# Patient Record
Sex: Female | Born: 1955 | Hispanic: Refuse to answer
Health system: Midwestern US, Community
[De-identification: ages and names within clinical notes are randomized; demographics above are authoritative.]

---

## 2018-08-03 ENCOUNTER — Ambulatory Visit: Attending: Internal Medicine

## 2018-08-03 ENCOUNTER — Ambulatory Visit: Admit: 2018-08-03 | Discharge: 2018-08-03 | Payer: PRIVATE HEALTH INSURANCE | Attending: Internal Medicine

## 2018-08-03 DIAGNOSIS — Z20822 Contact with and (suspected) exposure to covid-19: Secondary | ICD-10-CM

## 2018-08-03 DIAGNOSIS — Z20828 Contact with and (suspected) exposure to other viral communicable diseases: Secondary | ICD-10-CM

## 2018-08-03 NOTE — Progress Notes (Signed)
Letter printed and gave it to the flu clinic staff to give to patient when she comes by

## 2018-08-03 NOTE — Progress Notes (Signed)
Patient was seen at [facility] drive thru flu clinic.     Patient consented to receive care and bill insurance.     Please seen scanned note for visit documentation.     No symptoms- needs to be tested before going back to work

## 2018-08-03 NOTE — Progress Notes (Signed)
She is going to come by Monday to get written documentation that she is negative as she needs to get back to work. Told her to come by the flu clinic

## 2018-08-03 NOTE — Progress Notes (Signed)
Gave letter to Amil Amen at [facility]

## 2018-08-03 NOTE — Progress Notes (Signed)
Patient was seen at [facility] drive thru flu clinic.     Patient consented to receive care and bill insurance.     Please seen scanned note for visit documentation.     No symptoms- needs to be tested before going back to work

## 2018-08-03 NOTE — Progress Notes (Signed)
Gave letter to Julia at [facility]

## 2018-08-03 NOTE — Progress Notes (Signed)
Letter printed and gave it to the flu clinic staff to give to patient when she comes by

## 2018-08-03 NOTE — Progress Notes (Signed)
She is going to come by Monday to get written documentation that she is negative as she needs to get back to work. Told her to come by the flu clinic

## 2018-08-07 LAB — NOVEL CORONAVIRUS (COVID-19): SARS-CoV-2, NAA: NOT DETECTED

## 2018-08-07 LAB — COVID-19: SARS-CoV-2, NAA: NOT DETECTED

## 2019-01-22 IMAGING — CR XR CHEST 1 VIEW
1 series · 1 of 1 positions shown · non-contrast
Comparison: none

XR CHEST 1 VIEW
INDICATION: Shortness of breath
LOCATION CODE: 1

[AP]
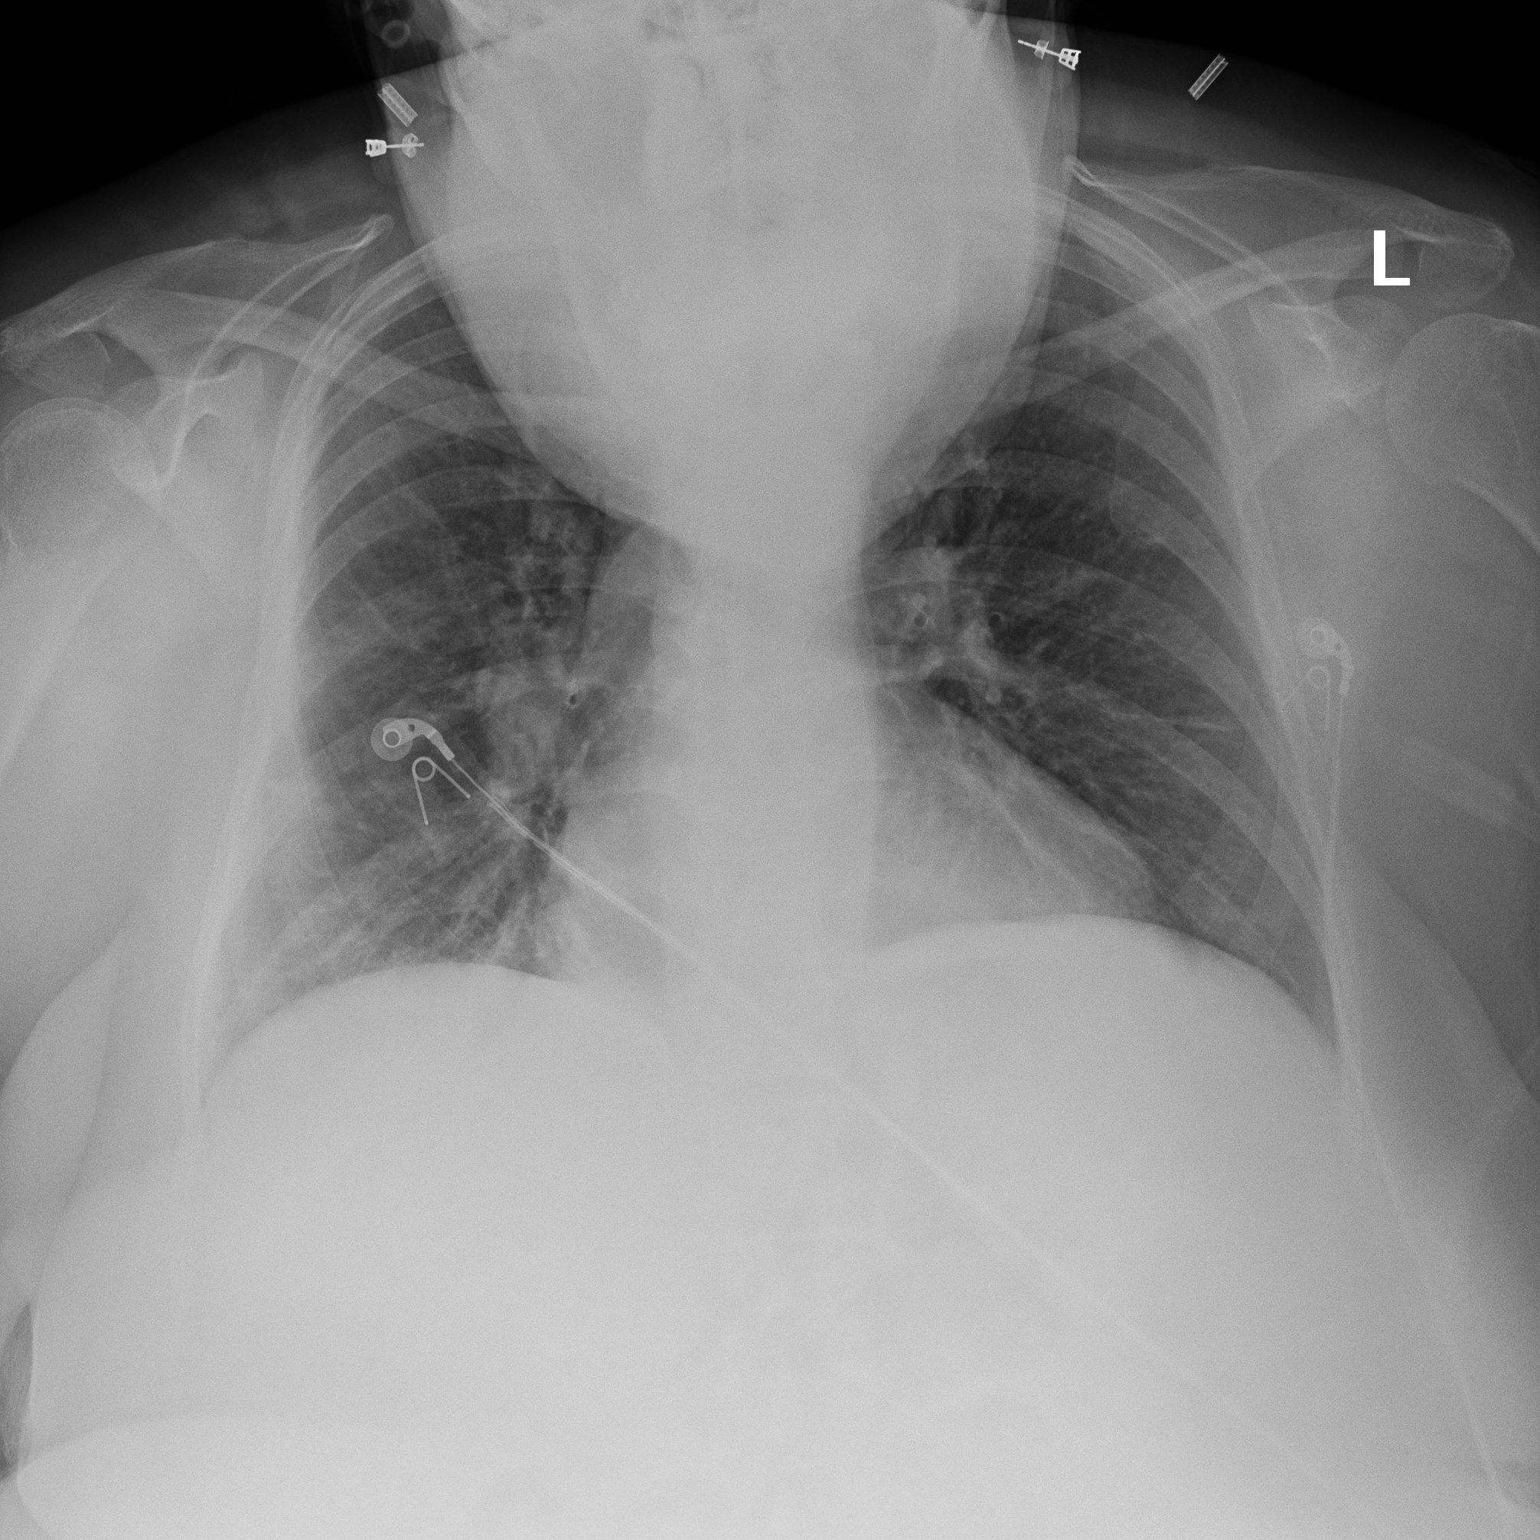

[1 of 1 positions shown; findings below may reference images not displayed]

FINDINGS: Limited study. The patient's face obscures majority of the upper lungs. Consider repeat. The lungs are grossly clear. Normal heart size. No large pneumothoraces. No pleural effusions.
IMPRESSION: Limited study. No gross acute findings. Recommend repeat.

## 2019-01-30 IMAGING — CR XR CHEST 1 VIEW
1 series · 1 of 1 positions shown · non-contrast
Comparison: none

SINGLE PORTABLE CHEST:
CLINICAL INDICATION: chest pain
REFERENCE: 01/22/2019

[AP]
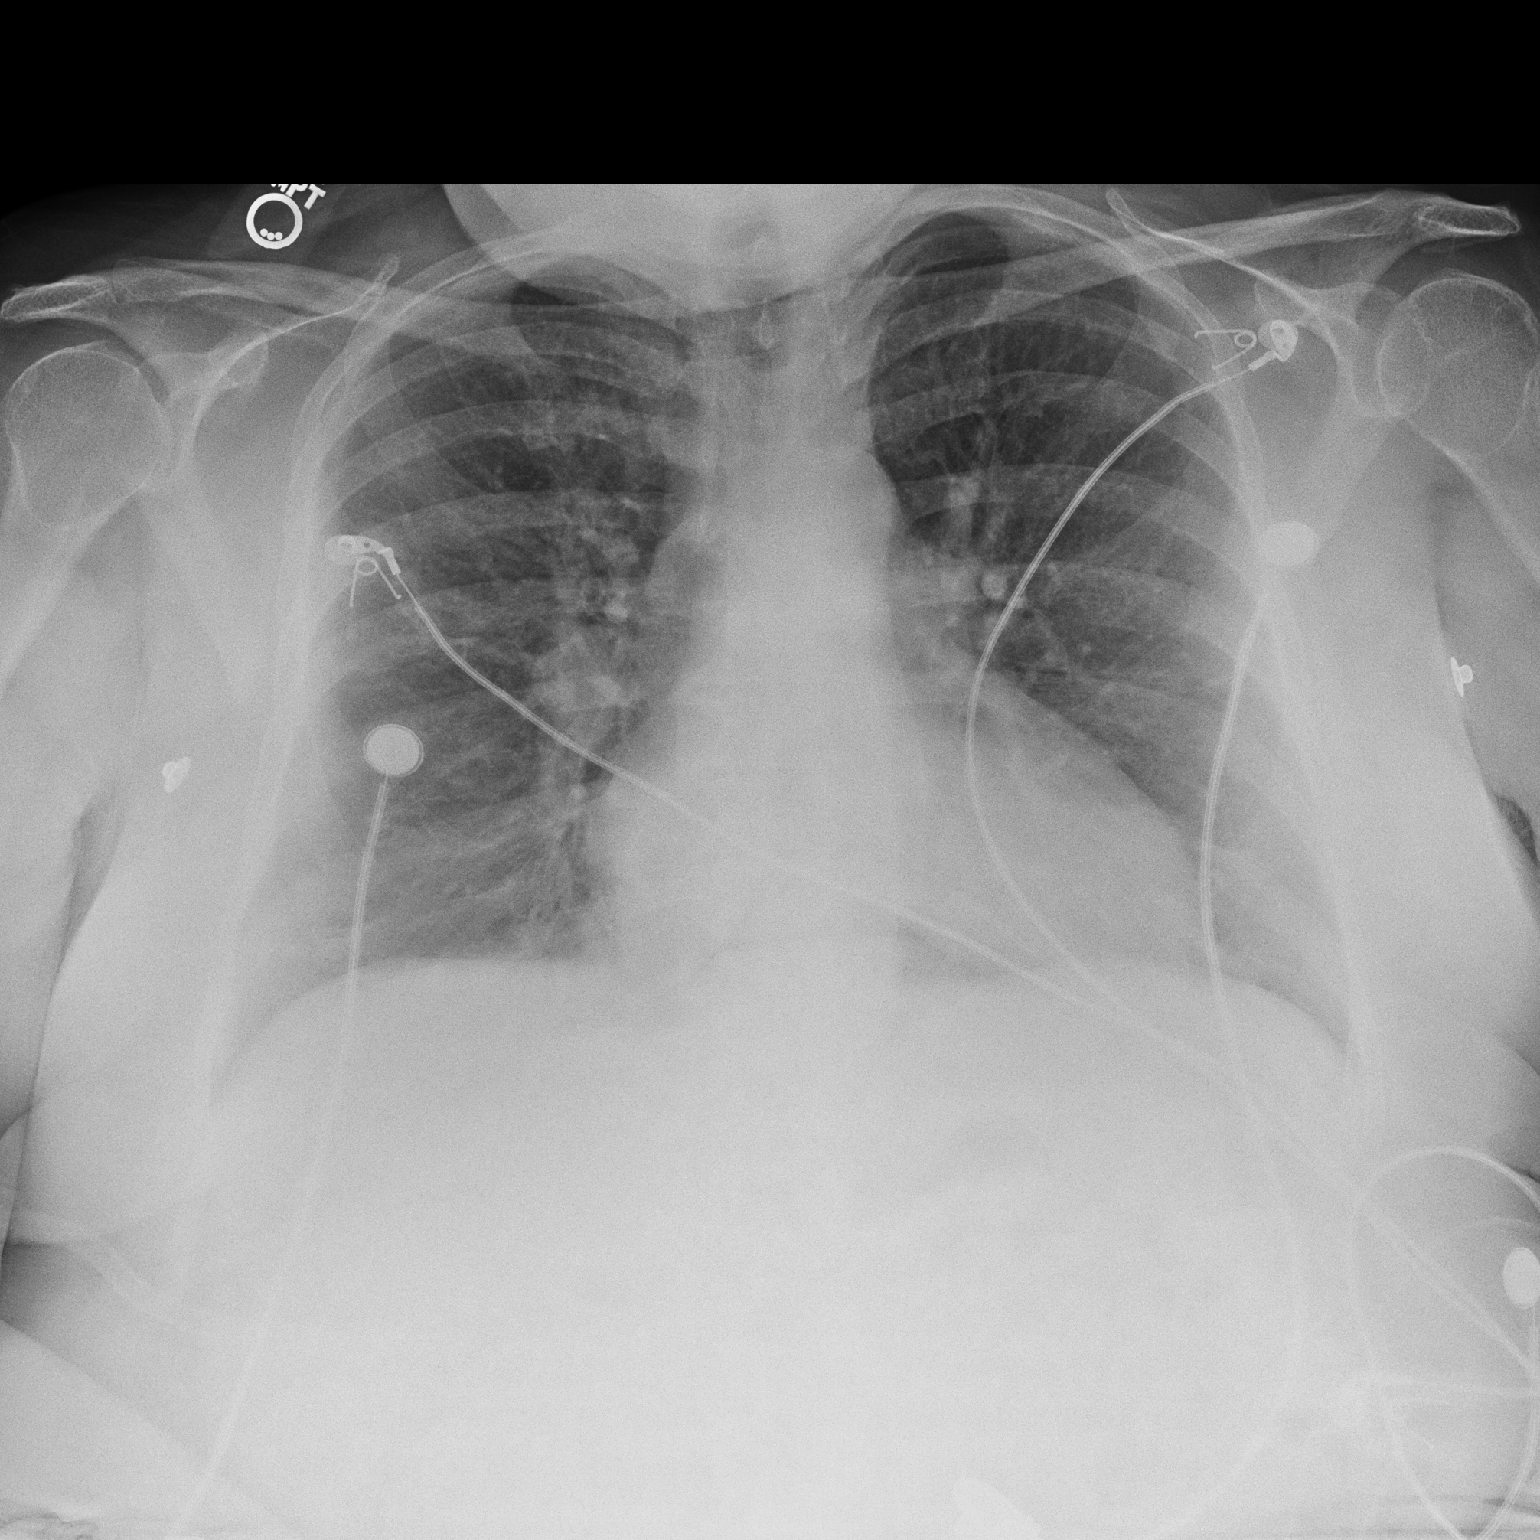

[1 of 1 positions shown; findings below may reference images not displayed]

FINDINGS: Single radiograph of the chest demonstrates normal cardiomediastinal contours.  Obesity and underpenetration limit evaluation.
The lungs demonstrate no mass, infiltrate, or effusion.
Surrounding osseous and soft tissue structures demonstrate no acute abnormality.
IMPRESSION: Limited exam with no acute cardiopulmonary process identified.
LOCATION CODE: 1

## 2019-02-23 IMAGING — MG MAMMO BREAST SCREENING BILATERAL
4 series · 4 of 4 positions shown · non-contrast
Comparison: none

This is a summary report. The complete report is available in the patient's medical record. If you cannot access the medical record, please contact the sending organization for a detailed fax or copy.
EXAM:
Mammogram breast screening bilateral
REASON FOR EXAM: screening
HISTORY: Patient is 63 y.o. No relevant family history has been documented for this patient. Hormone history includes other and estrogen replacement therapy. Surgical history includes hysterectomy. Medical history includes hypertension and diabetes mellitus.
LMP: No LMP recorded. Patient has had a hysterectomy.
BREAST COMPOSITION:
The breasts are almost entirely fatty.

[L MLO]
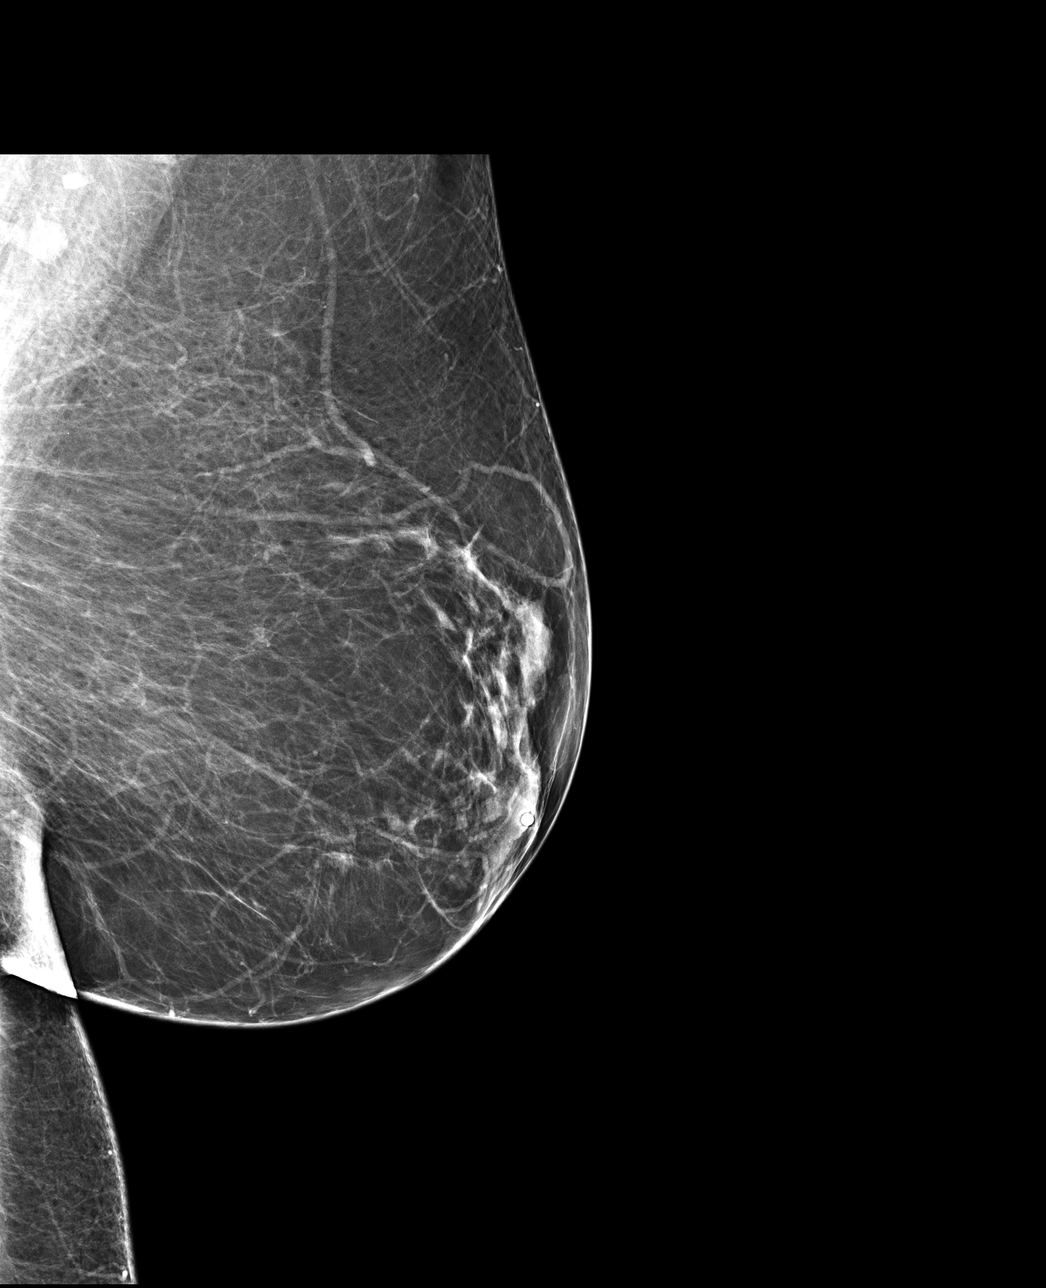

[L CC]
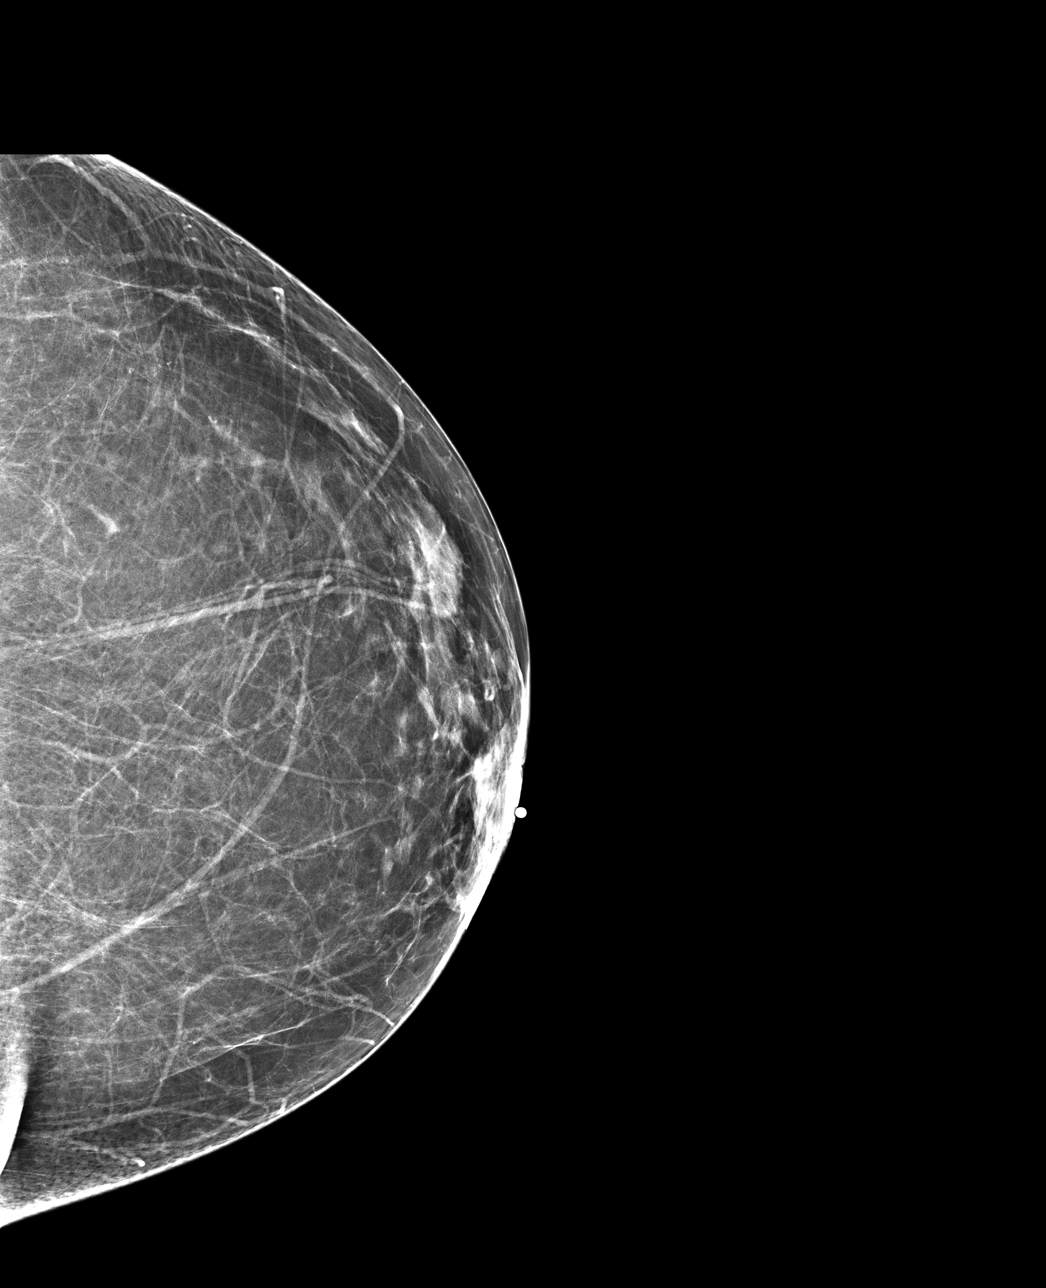

[R CC]
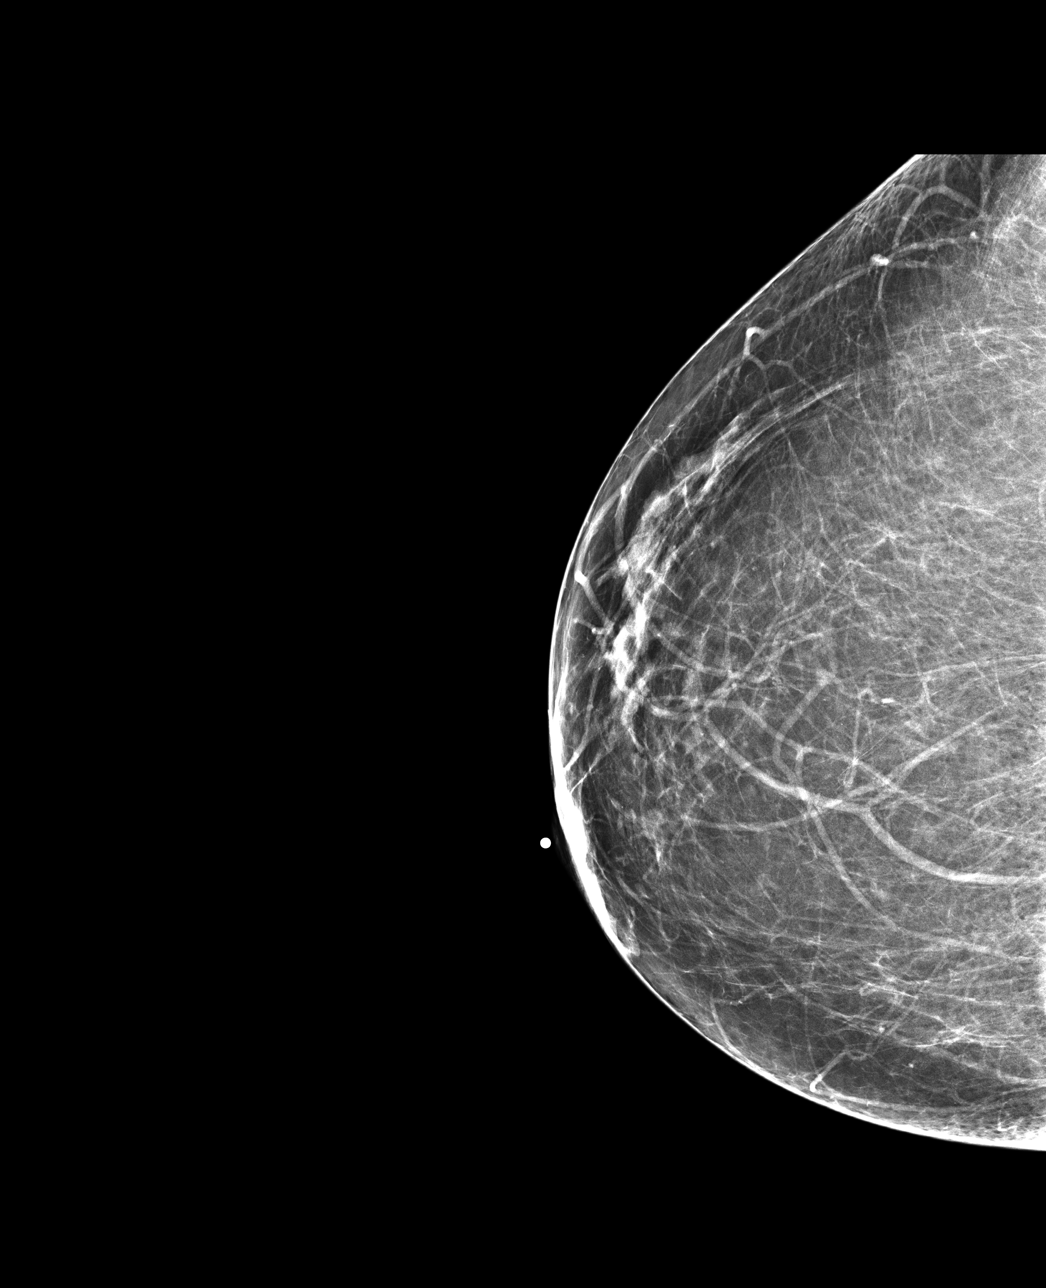

[R MLO]
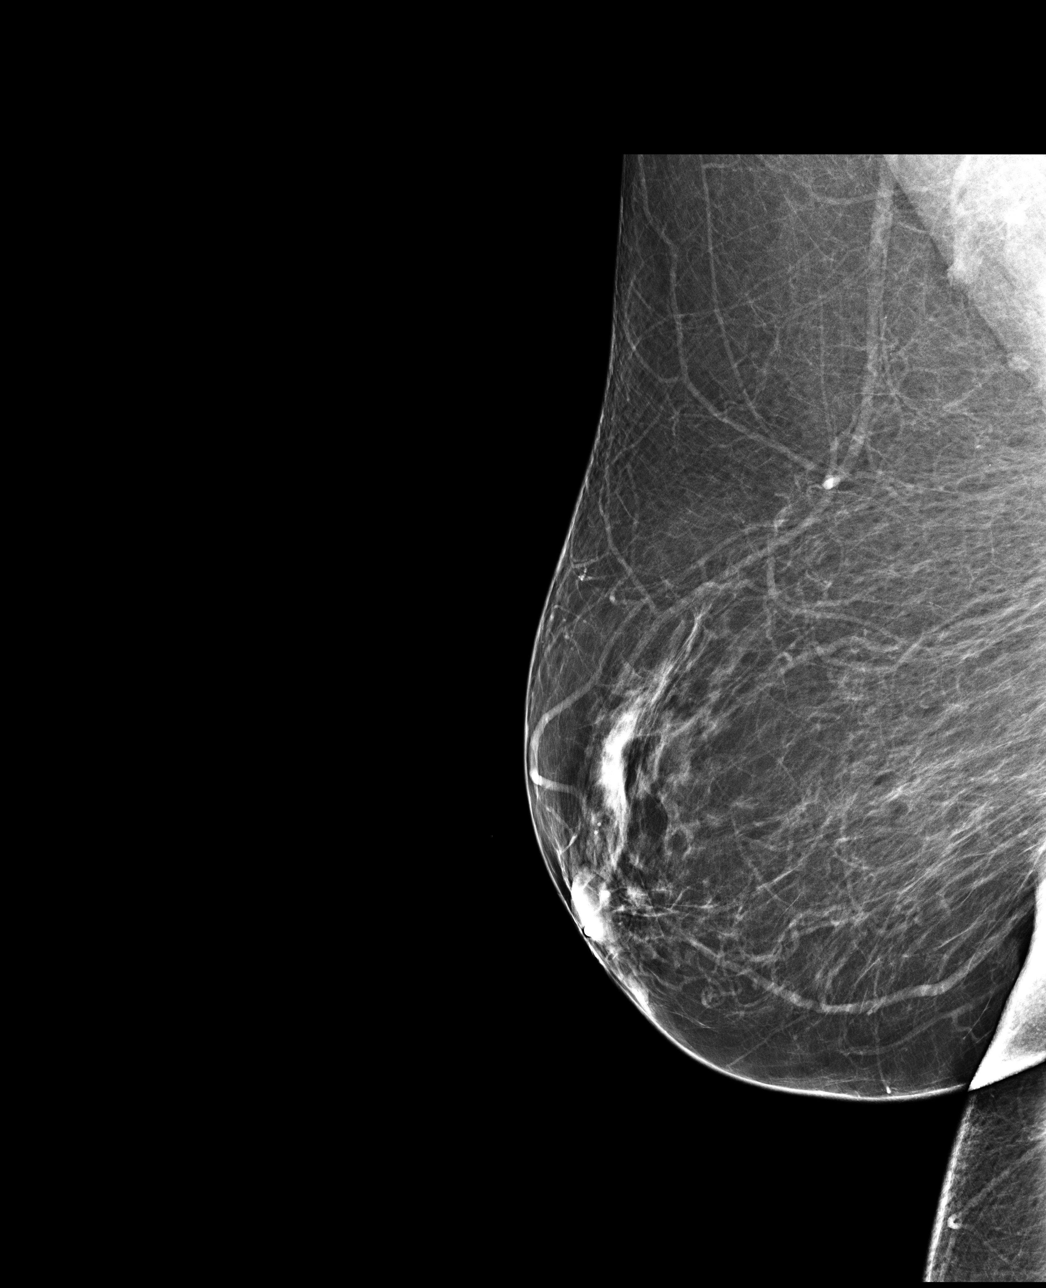

[4 of 4 positions shown; findings below may reference images not displayed]

FINDINGS: Computer-aided detection was utilized by the radiologist in the interpretation of this examination.
There are no suspicious masses, calcifications or areas of architectural distortion. No evidence of malignancy.
FILMS COMPARED:
08/12/2015 SCREEN MAMMO DIR DIGITAL BIALT
11/05/2016 SCREEN MAMMO DIR DIGITAL BIALT
11/16/2017 SCREEN MAMMO DIR DIGITAL BIALT
IMPRESSION: No mammographic evidence of malignancy.
BI-RADS CATEGORY:
Left: 2 - Benign
Right: 2 - Benign
Overall: 2 - Benign
RECOMMENDATION:
- Routine Screening Mammogram in 1 Yr for both breasts.
LOCATION CODE: 1

## 2020-04-17 IMAGING — MG MAMMO BREAST SCREENING BILATERAL
4 series · 4 of 4 positions shown · non-contrast
Comparison: none

This is a summary report. The complete report is available in the patient's medical record. If you cannot access the medical record, please contact the sending organization for a detailed fax or copy.
EXAM:
Mammogram breast screening bilateral
REASON FOR EXAM: screening
HISTORY: Patient is 64 y.o. Family medical history includes diabetes in sister and hypertension in 2 relatives (mother, sister). Hormone history includes other and estrogen replacement therapy. Surgical history includes hysterectomy. Medical history includes hypertension and diabetes mellitus.
LMP: No LMP recorded. Patient has had a hysterectomy.
BREAST COMPOSITION:
The breasts have scattered areas of fibroglandular density.

[R CC]
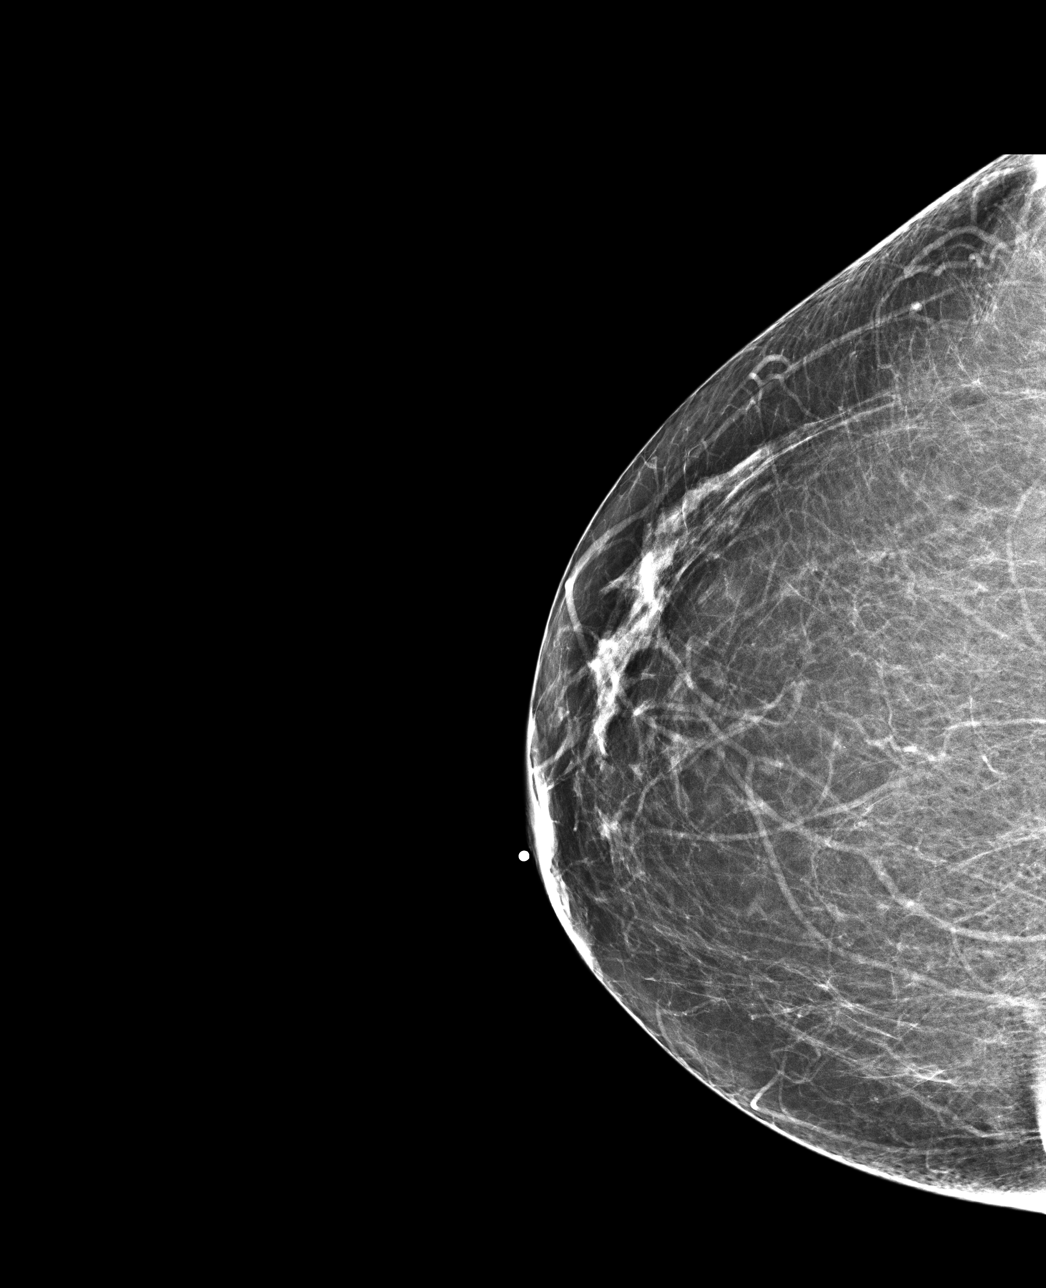

[L MLO]
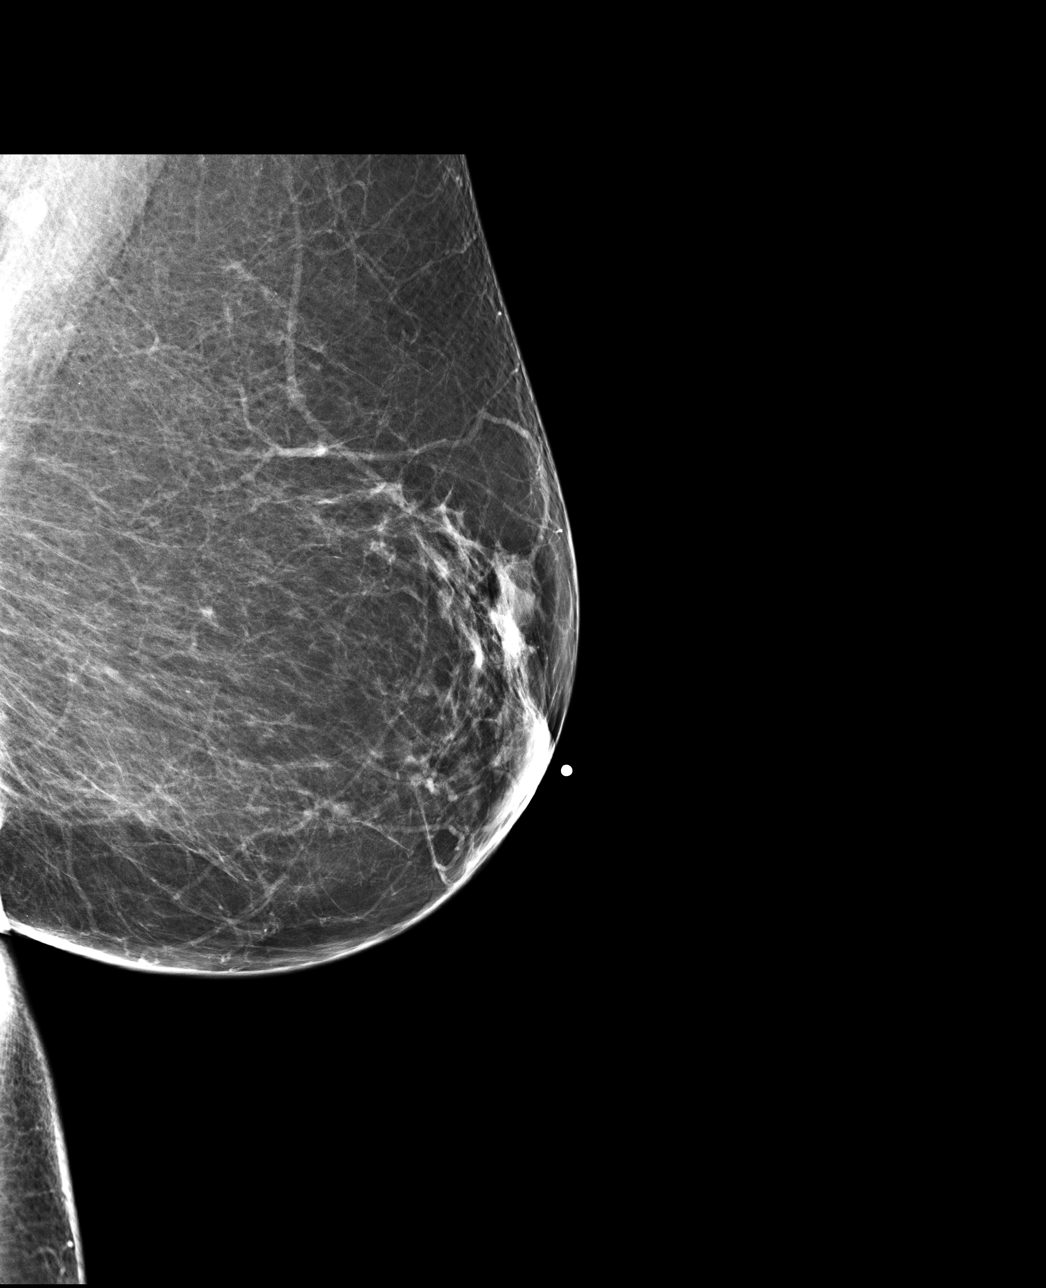

[L CC]
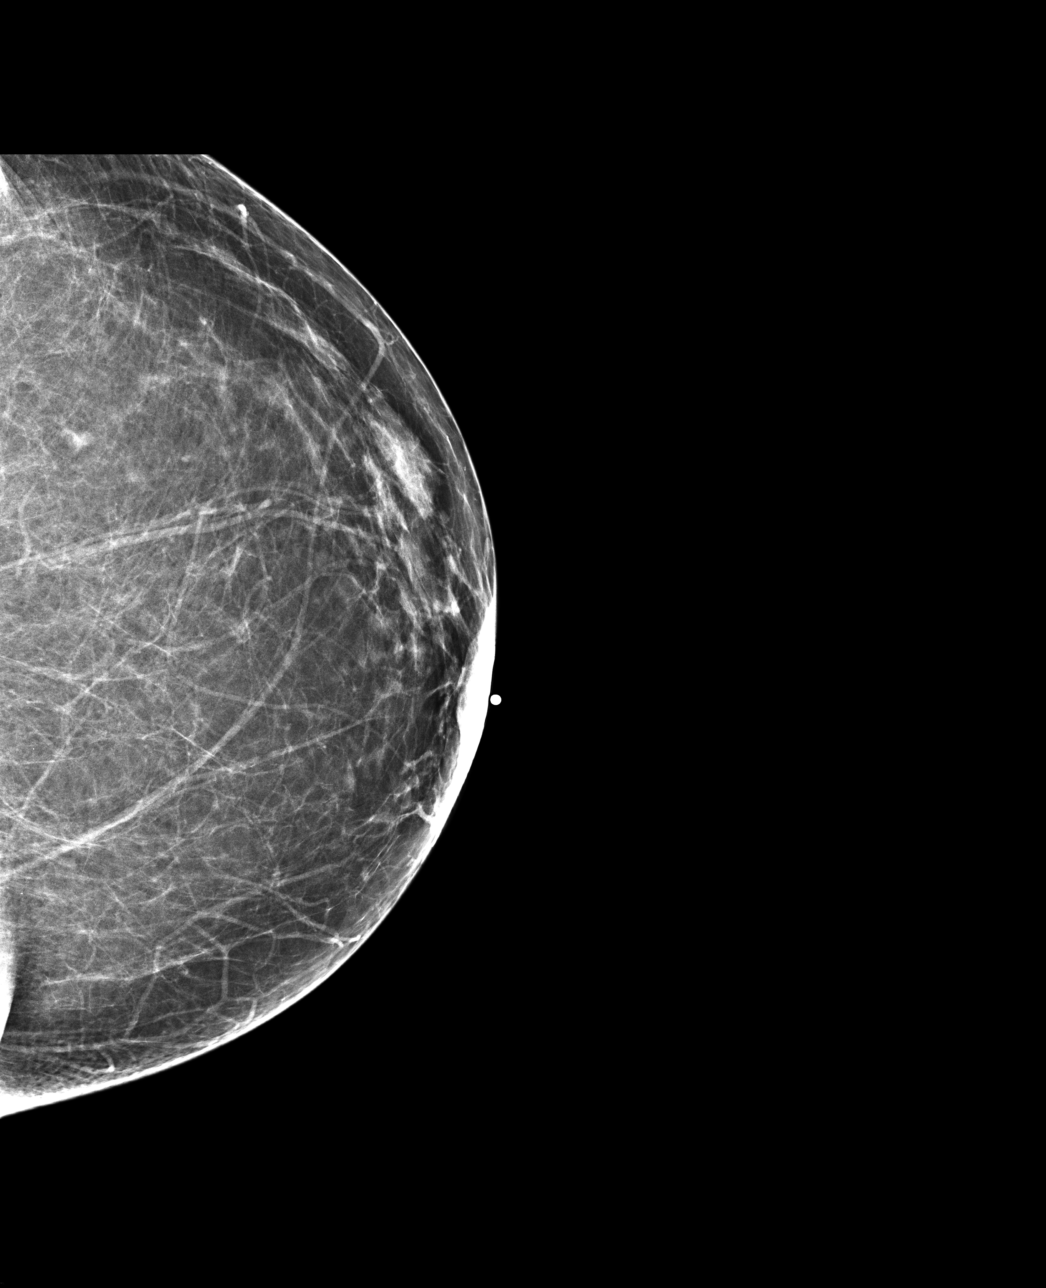

[R MLO]
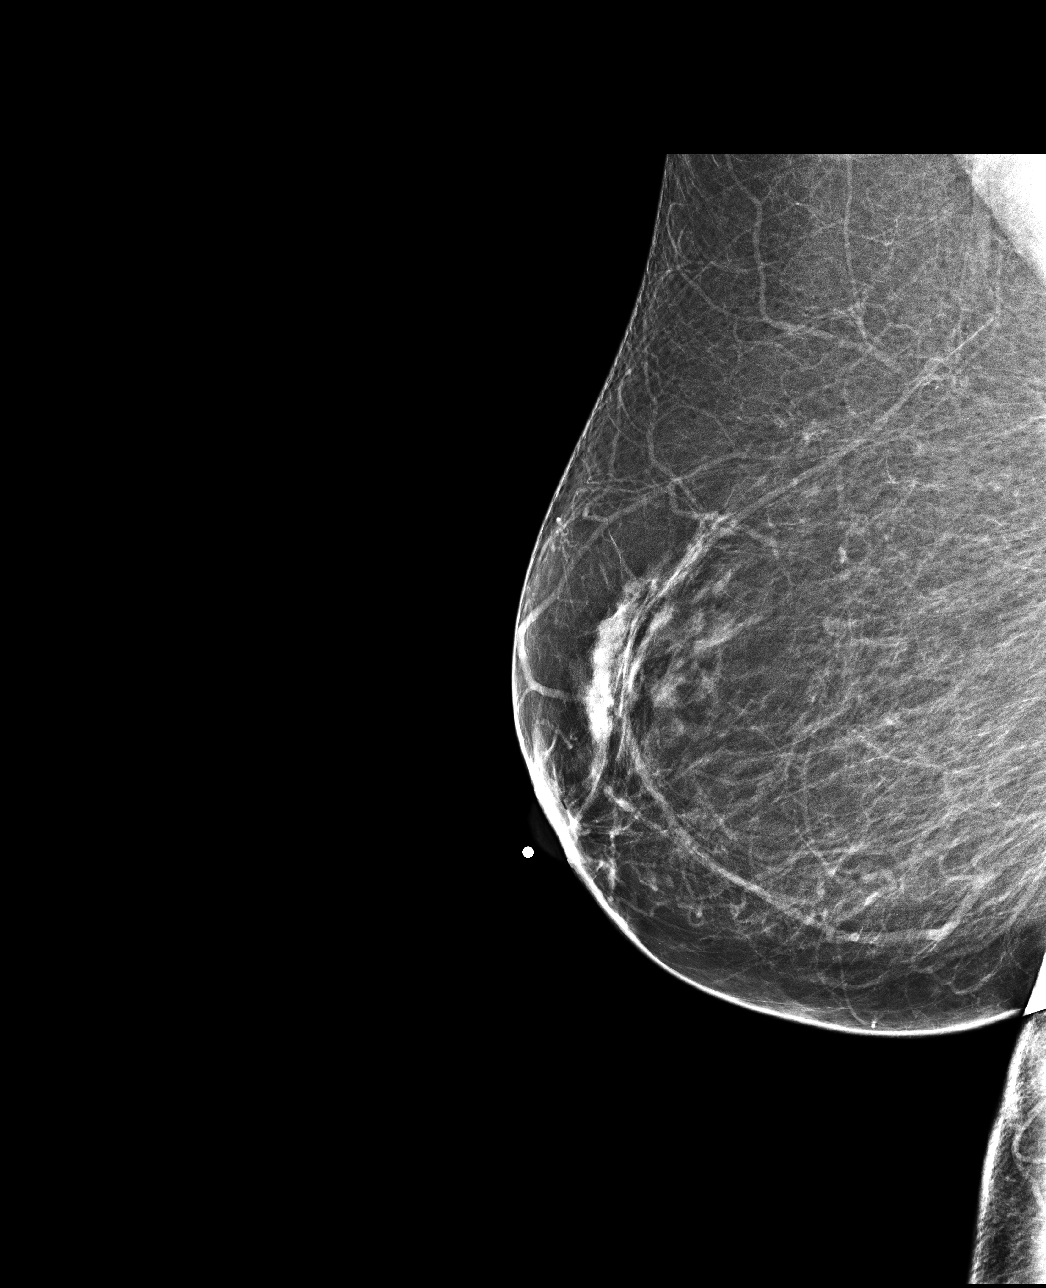

[4 of 4 positions shown; findings below may reference images not displayed]

FINDINGS: Computer-aided detection was utilized by the radiologist in the interpretation of this examination.
There are no suspicious masses, calcifications or areas of architectural distortion. No evidence of malignancy.
FILMS COMPARED:
05/03/2014 Mammogram breast diagnostic bilateral
08/12/2015 SCREEN MAMMO DIR DIGITAL BIALT
11/05/2016 SCREEN MAMMO DIR DIGITAL BIALT
11/16/2017 SCREEN MAMMO DIR DIGITAL BIALT
02/23/2019 Mammogram breast screening bilateral
IMPRESSION: No mammographic evidence of malignancy.
BI-RADS CATEGORY:
Overall: 1 - Negative
RECOMMENDATION:
- Routine Screening Mammogram in 1 Yr.
LOCATION CODE: 1

## 2021-01-31 IMAGING — MR MRI LUMBAR SPINE WITHOUT CONTRAST
6 of 9 series · 23 of 48 positions shown · non-contrast
Comparison: None available

FINAL REPORT:
HISTORY: Lumbar radiculopathy
Procedure: MRI of the lumbar spine without contrast
TECHNIQUE: Multisequence, multiplanar imaging of the lumbar spine was performed.

[Series 5001: survey · sagittal · 8.0mm · 0.88mm/px · 2 of 8 slices shown]
[im 1/8]
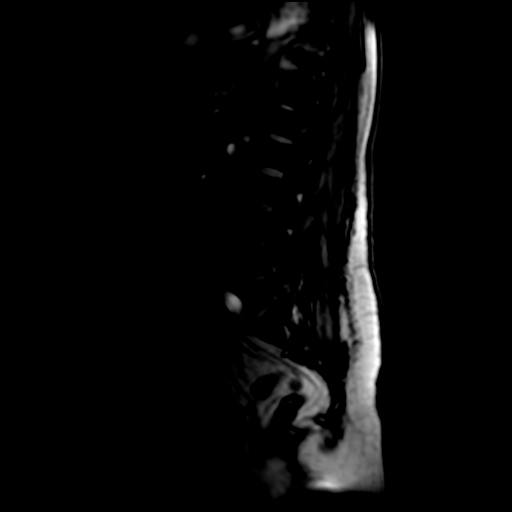
[im 8/8]
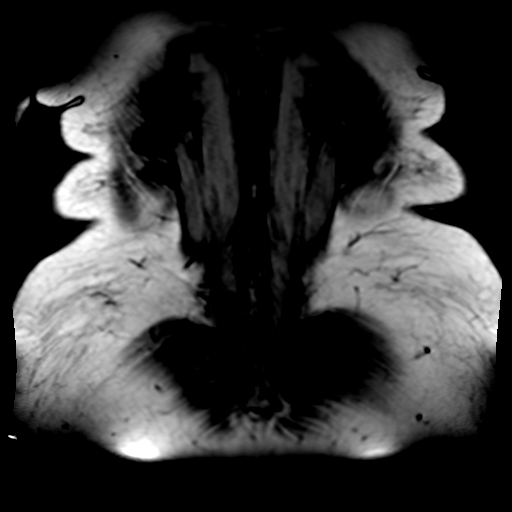

[Series 6001: T2 · sagittal · 4.0mm · 0.55mm/px · 3 of 17 slices shown (1 of 2)]
[im 1/17]
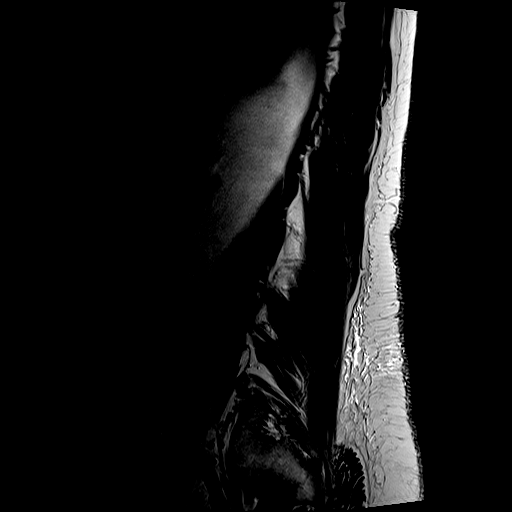
[im 9/17]
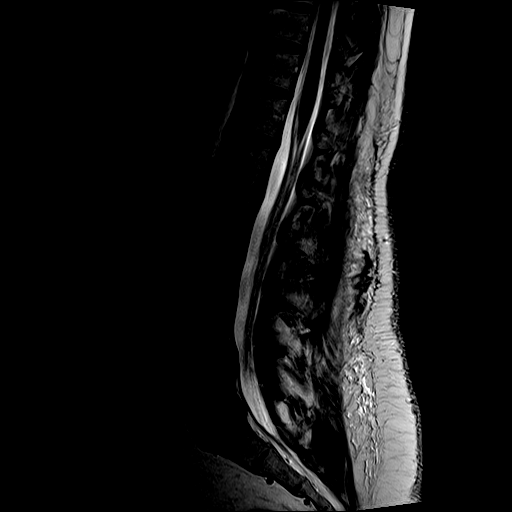
[im 17/17]
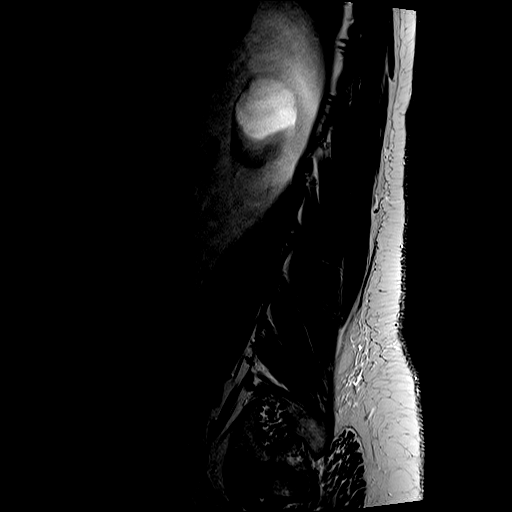

[Series 7001: STIR · sagittal · 4.0mm · 0.88mm/px · 3 of 17 slices shown]
[im 1/17]
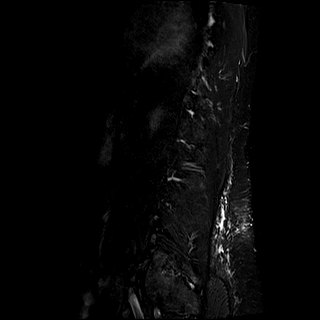
[im 9/17]
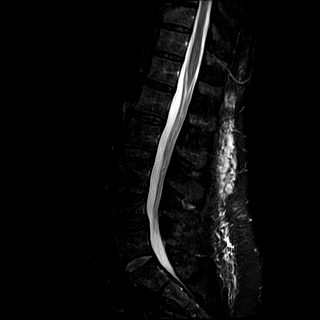
[im 17/17]
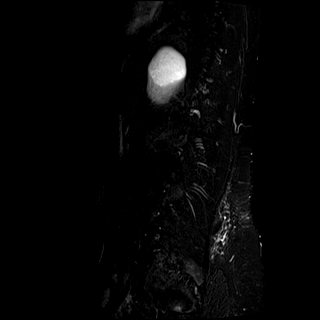

[Series 8001: T1 · sagittal · 4.0mm · 0.62mm/px · 3 of 17 slices shown (1 of 2)]
[im 1/17]
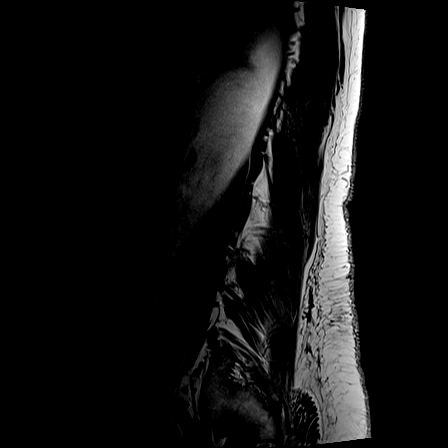
[im 9/17]
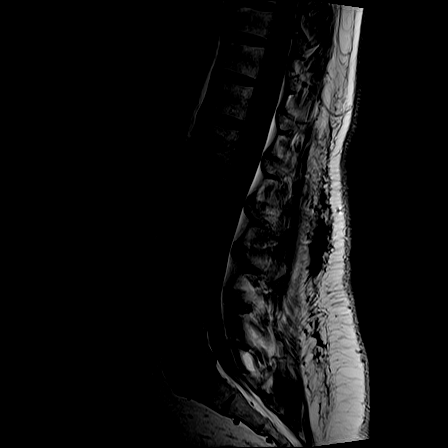
[im 17/17]
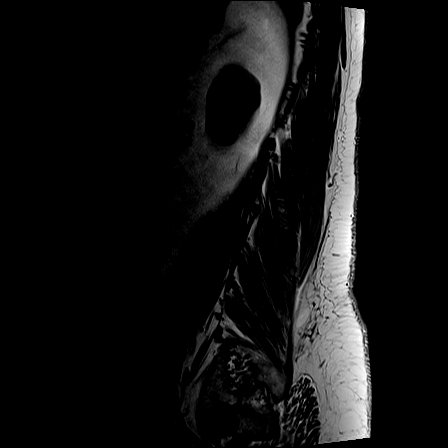

[Series 9001: T2 · axial · 4.0mm · 0.49mm/px · z∈[-99,+101]mm · 6 of 32 slices shown (2 of 2)]
[im 1/32]
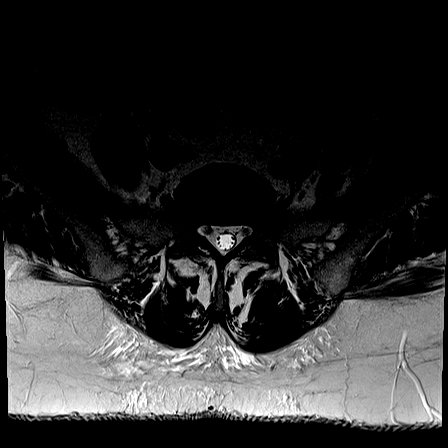
[im 7/32]
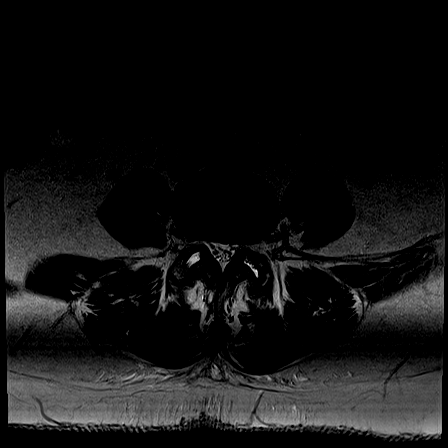
[im 13/32]
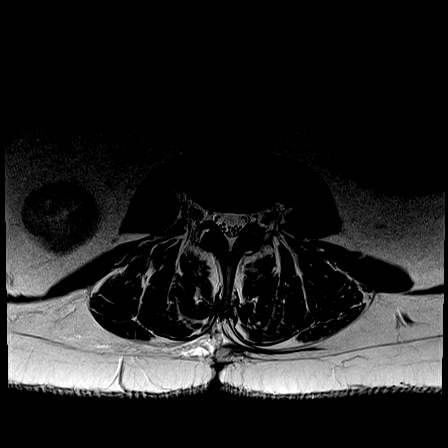
[im 19/32]
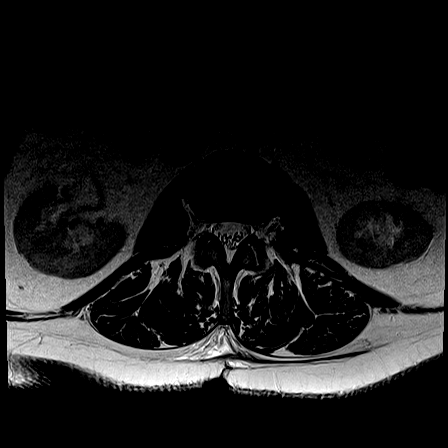
[im 25/32]
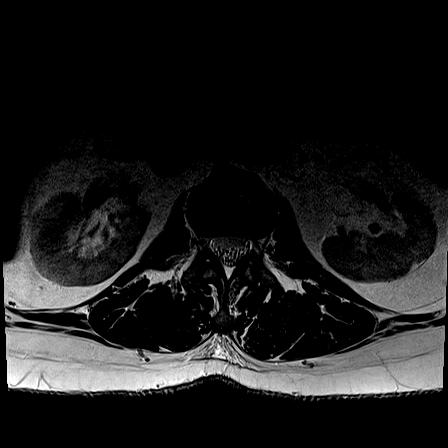
[im 32/32]
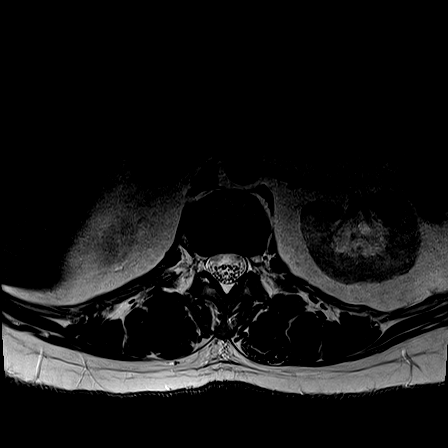

[T1 · axial · 4.0mm · 0.49mm/px · z∈[-99,+101]mm · 6 of 32 slices shown (2 of 2)]
[im 1/32]
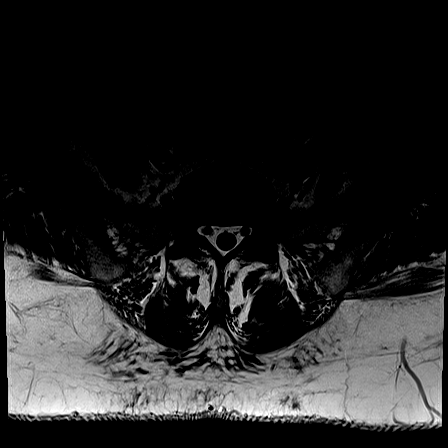
[im 7/32]
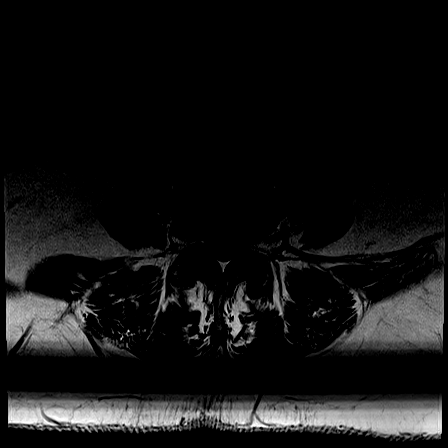
[im 13/32]
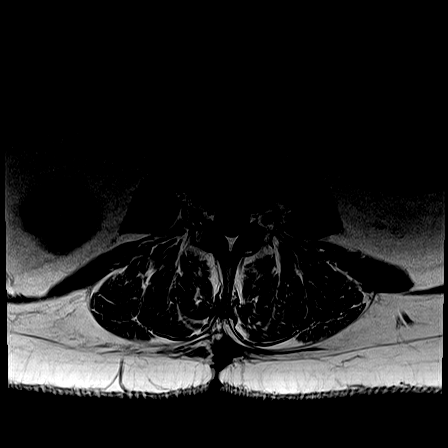
[im 19/32]
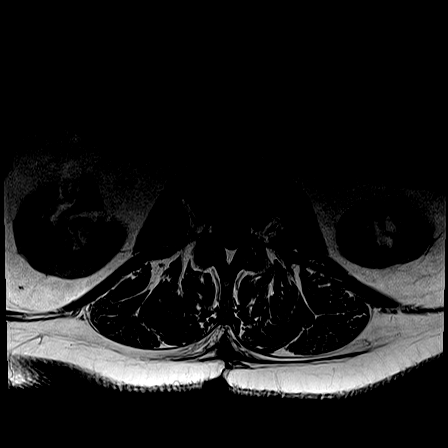
[im 25/32]
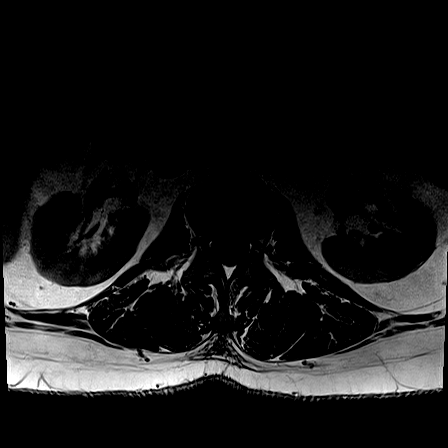
[im 32/32]
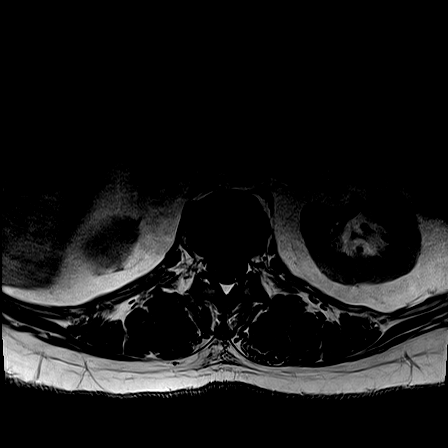

[23 of 48 positions shown; findings below may reference images not displayed]

FINDINGS: No pars defects are noted. The conus medullaris ends at T12-L1 and is normal in appearance. No aggressive marrow signal abnormality is seen. Fluid is seen in the facet joints bilaterally at L4-5. Prevertebral and paraspinal soft tissues are unremarkable.
L1-L2: No significant spinal stenosis or neural foraminal narrowing.
L2-3: No significant spinal stenosis or neural foraminal narrowing.
L3-L4: Ligamentum flavum hypertrophy and broad-based protrusion with mild spinal stenosis and mild neural foraminal narrowing
L4-5: Posterior element hypertrophy and broad-based protrusion with moderate spinal stenosis. Mild bilateral neural foraminal narrowing.
L5-S1: No spinal stenosis. No neural foraminal narrowing.
IMPRESSION: 
IMPRESSION: Degenerative changes with spinal stenosis and neural foraminal narrowing as described above level by level

## 2021-04-18 IMAGING — MG MAMMO BREAST SCREENING BILATERAL
6 series · 6 of 6 positions shown · non-contrast
Comparison: 04/17/2020
Breast density: Scattered fibroglandular densities (B)

This is a summary report. The complete report is available in the patient's medical record. If you cannot access the medical record, please contact the sending organization for a detailed fax or copy.
FINAL REPORT:
MAMMO BREAST SCREENING BILATERAL
INDICATION: 65-year-old female with no personal or family history of breast cancer.
TECHNIQUE: Bilateral digital CC and MLO images of the breasts were obtained. CAD was utilized.

[L CC]
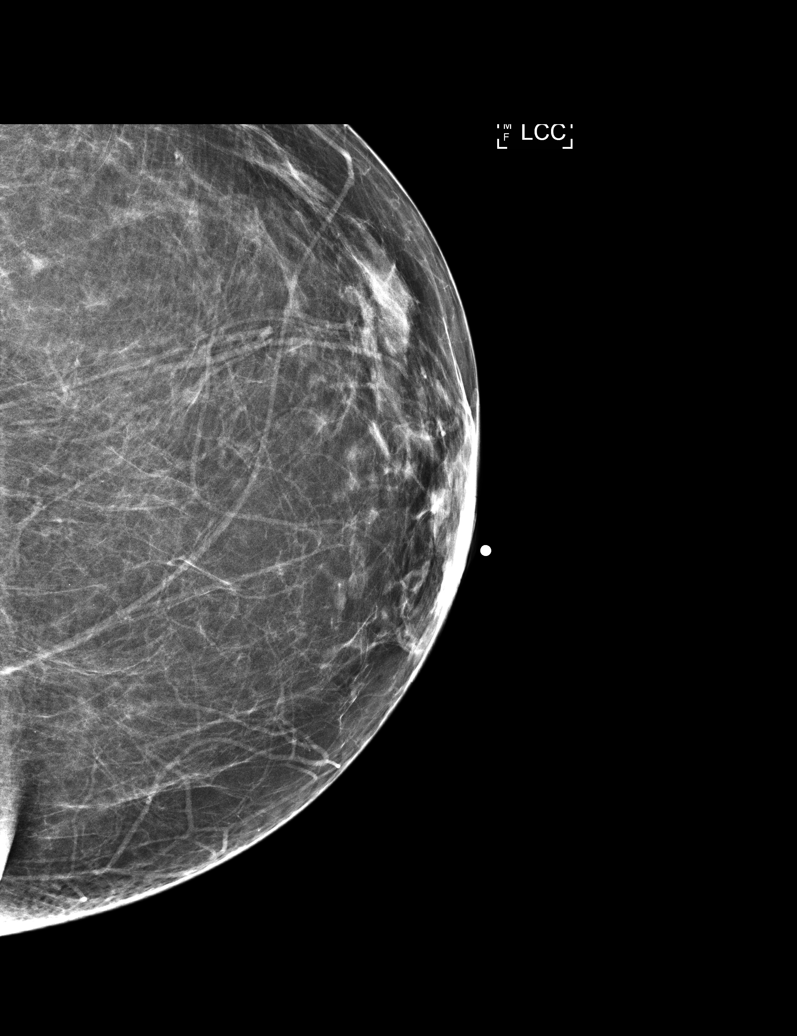

[R MLO]
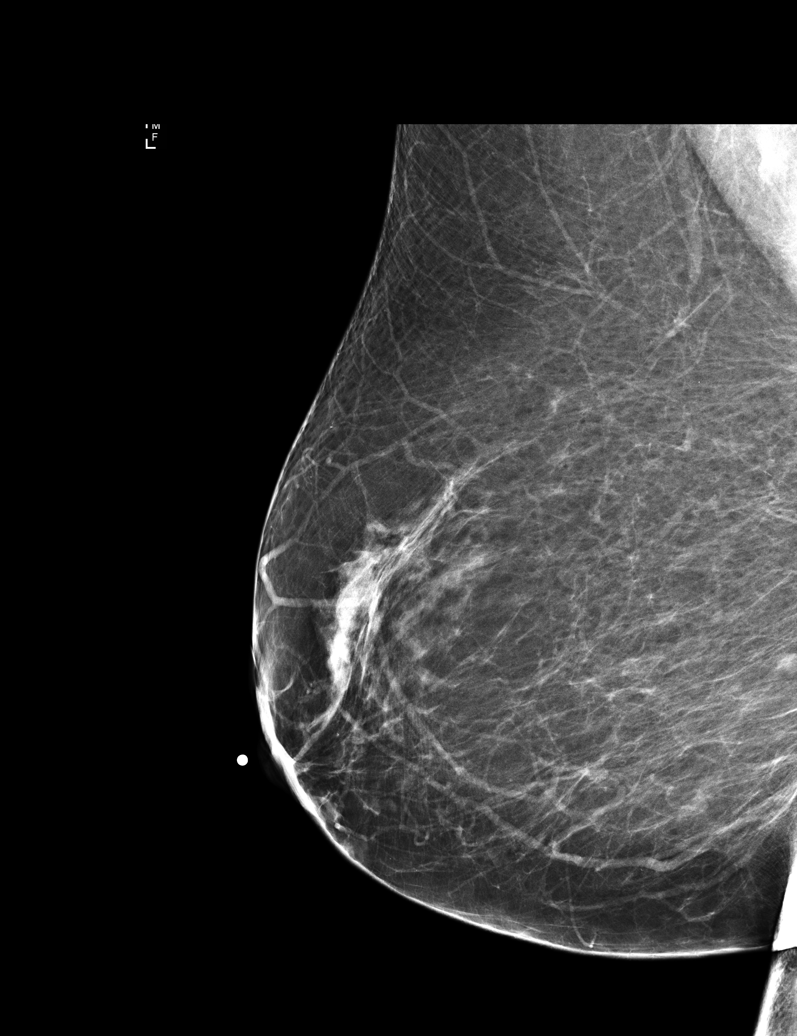

[R XCCL]
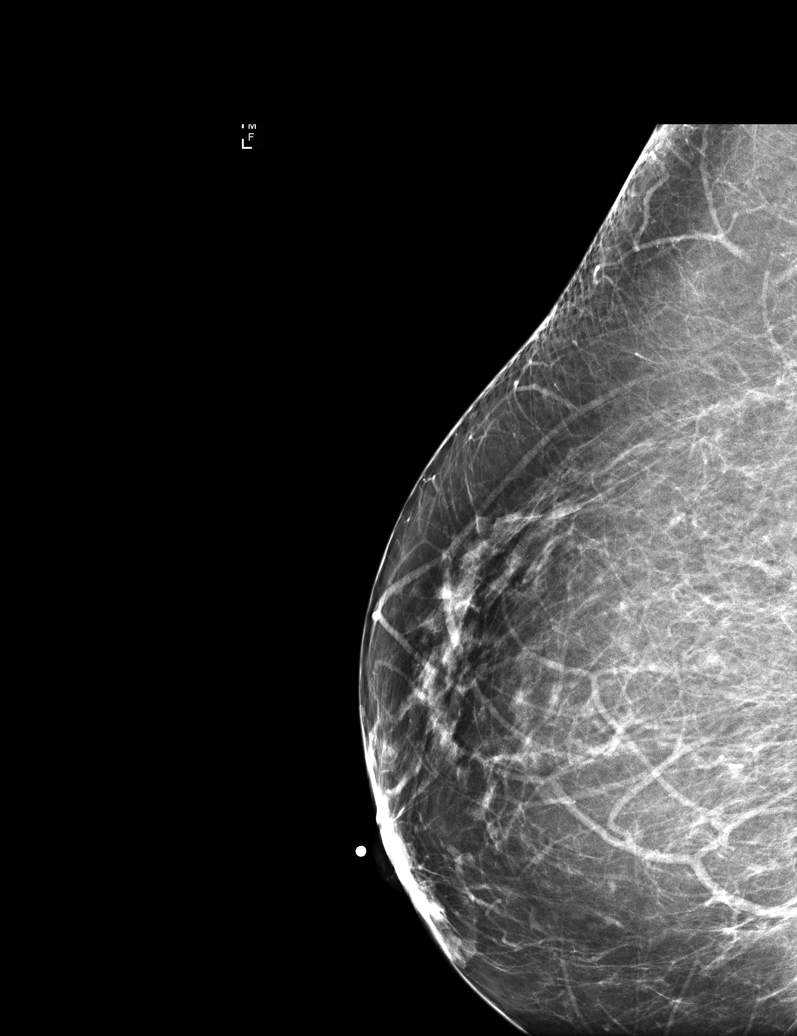

[L MLO]
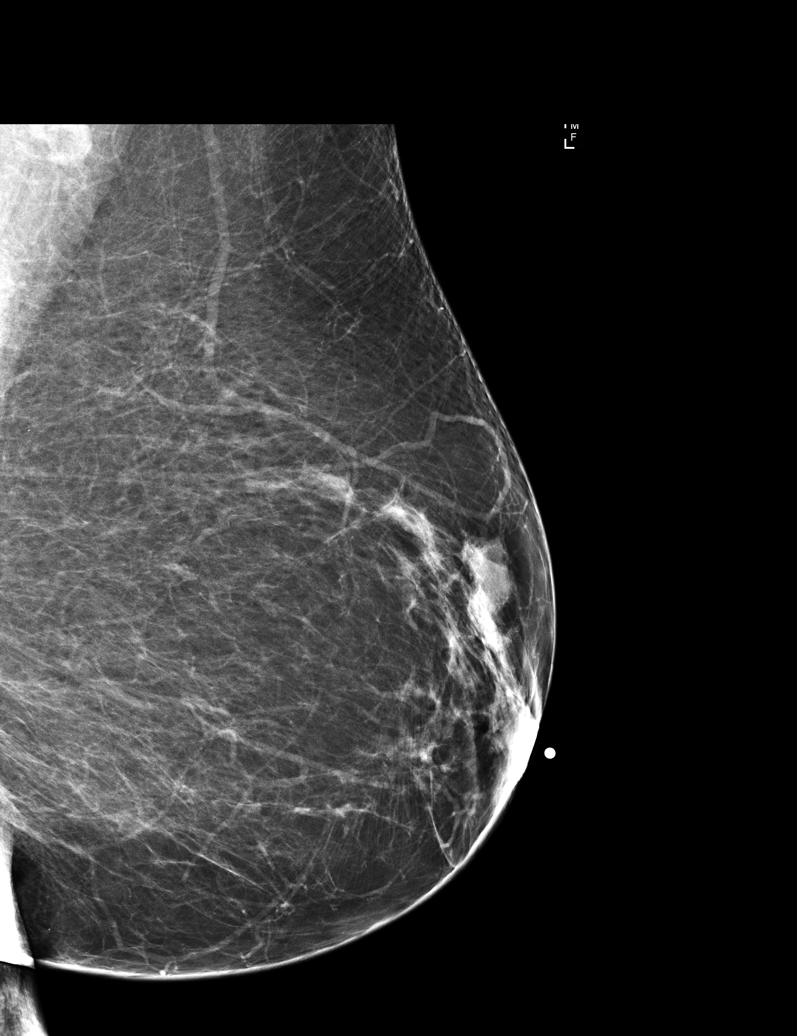

[L XCCL]
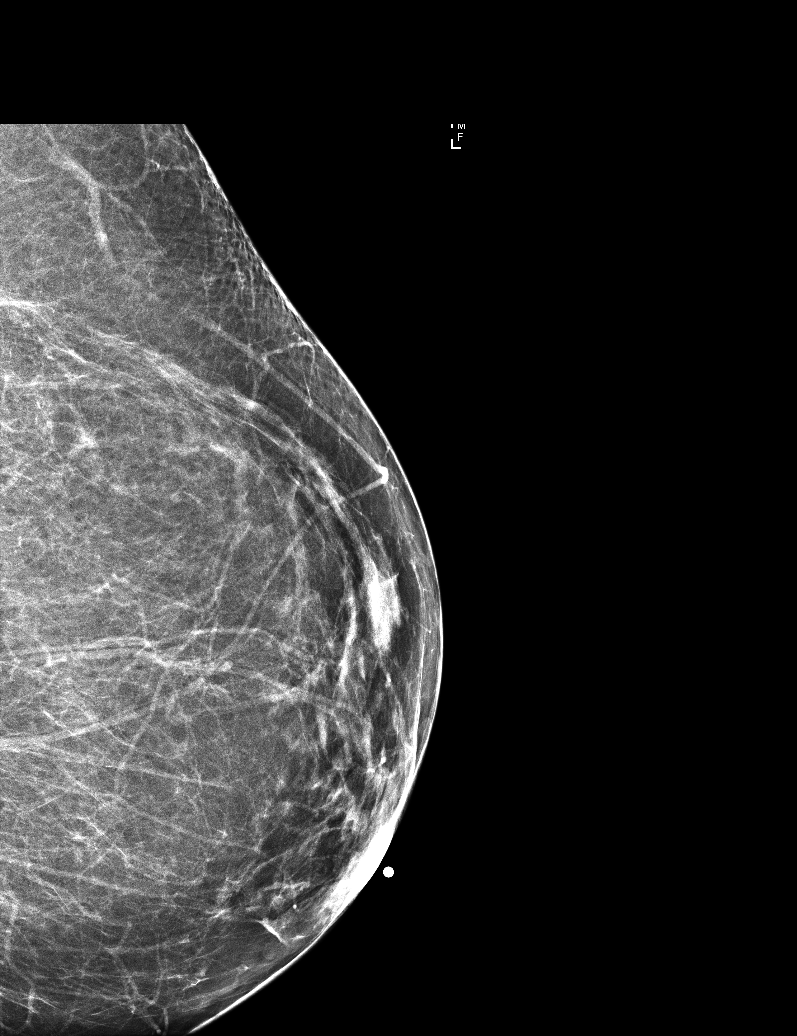

[R CC]
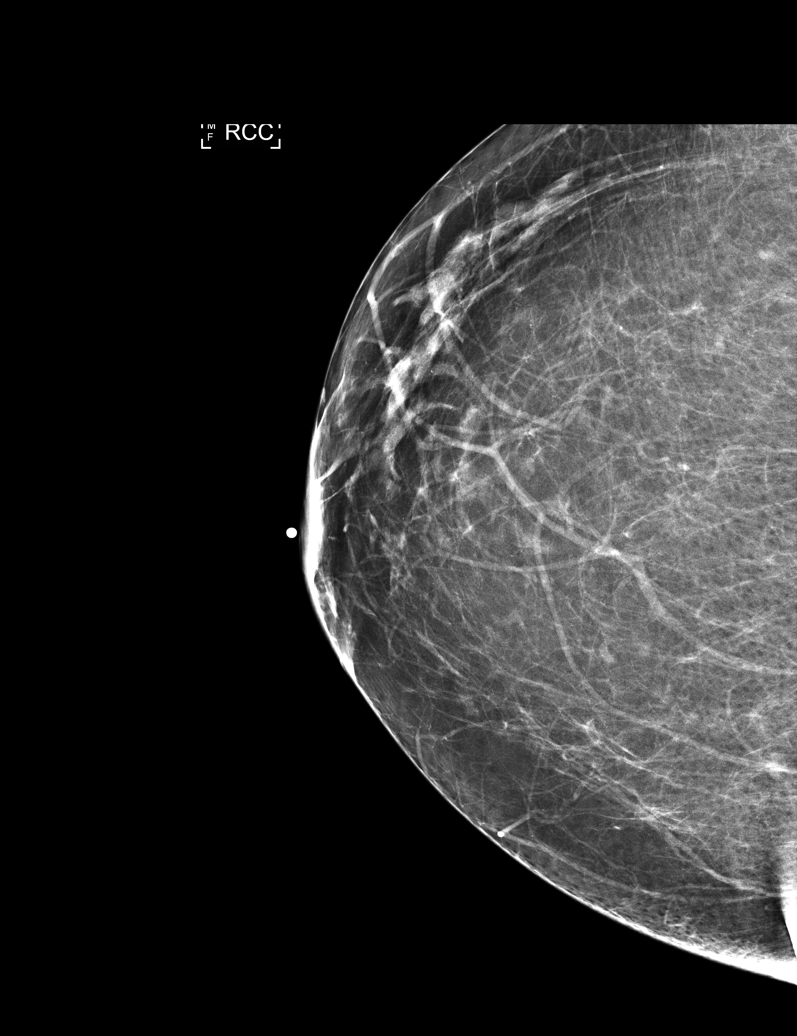

[6 of 6 positions shown; findings below may reference images not displayed]

FINDINGS: No significant abnormalities are noted.
IMPRESSION: 
IMPRESSION: No mammographic evidence of malignancy.
Recommendations: Annual screening.
BI-RADS code: 1; negative
**One must recognize that there is as high as a 10-15% false-negative rate on a single screening mammogram. Current recommendations are for annual screening mammography from age 40 onwards.**
The patient will be entered into a reminder system with a target due date for the next mammogram.

## 2021-11-05 IMAGING — CR XR CHEST 1 VIEW
1 series · 1 of 1 positions shown · non-contrast
Comparison: Chest x-ray from 01/30/2019

FINAL REPORT:
INDICATION: chest pain

[AP]
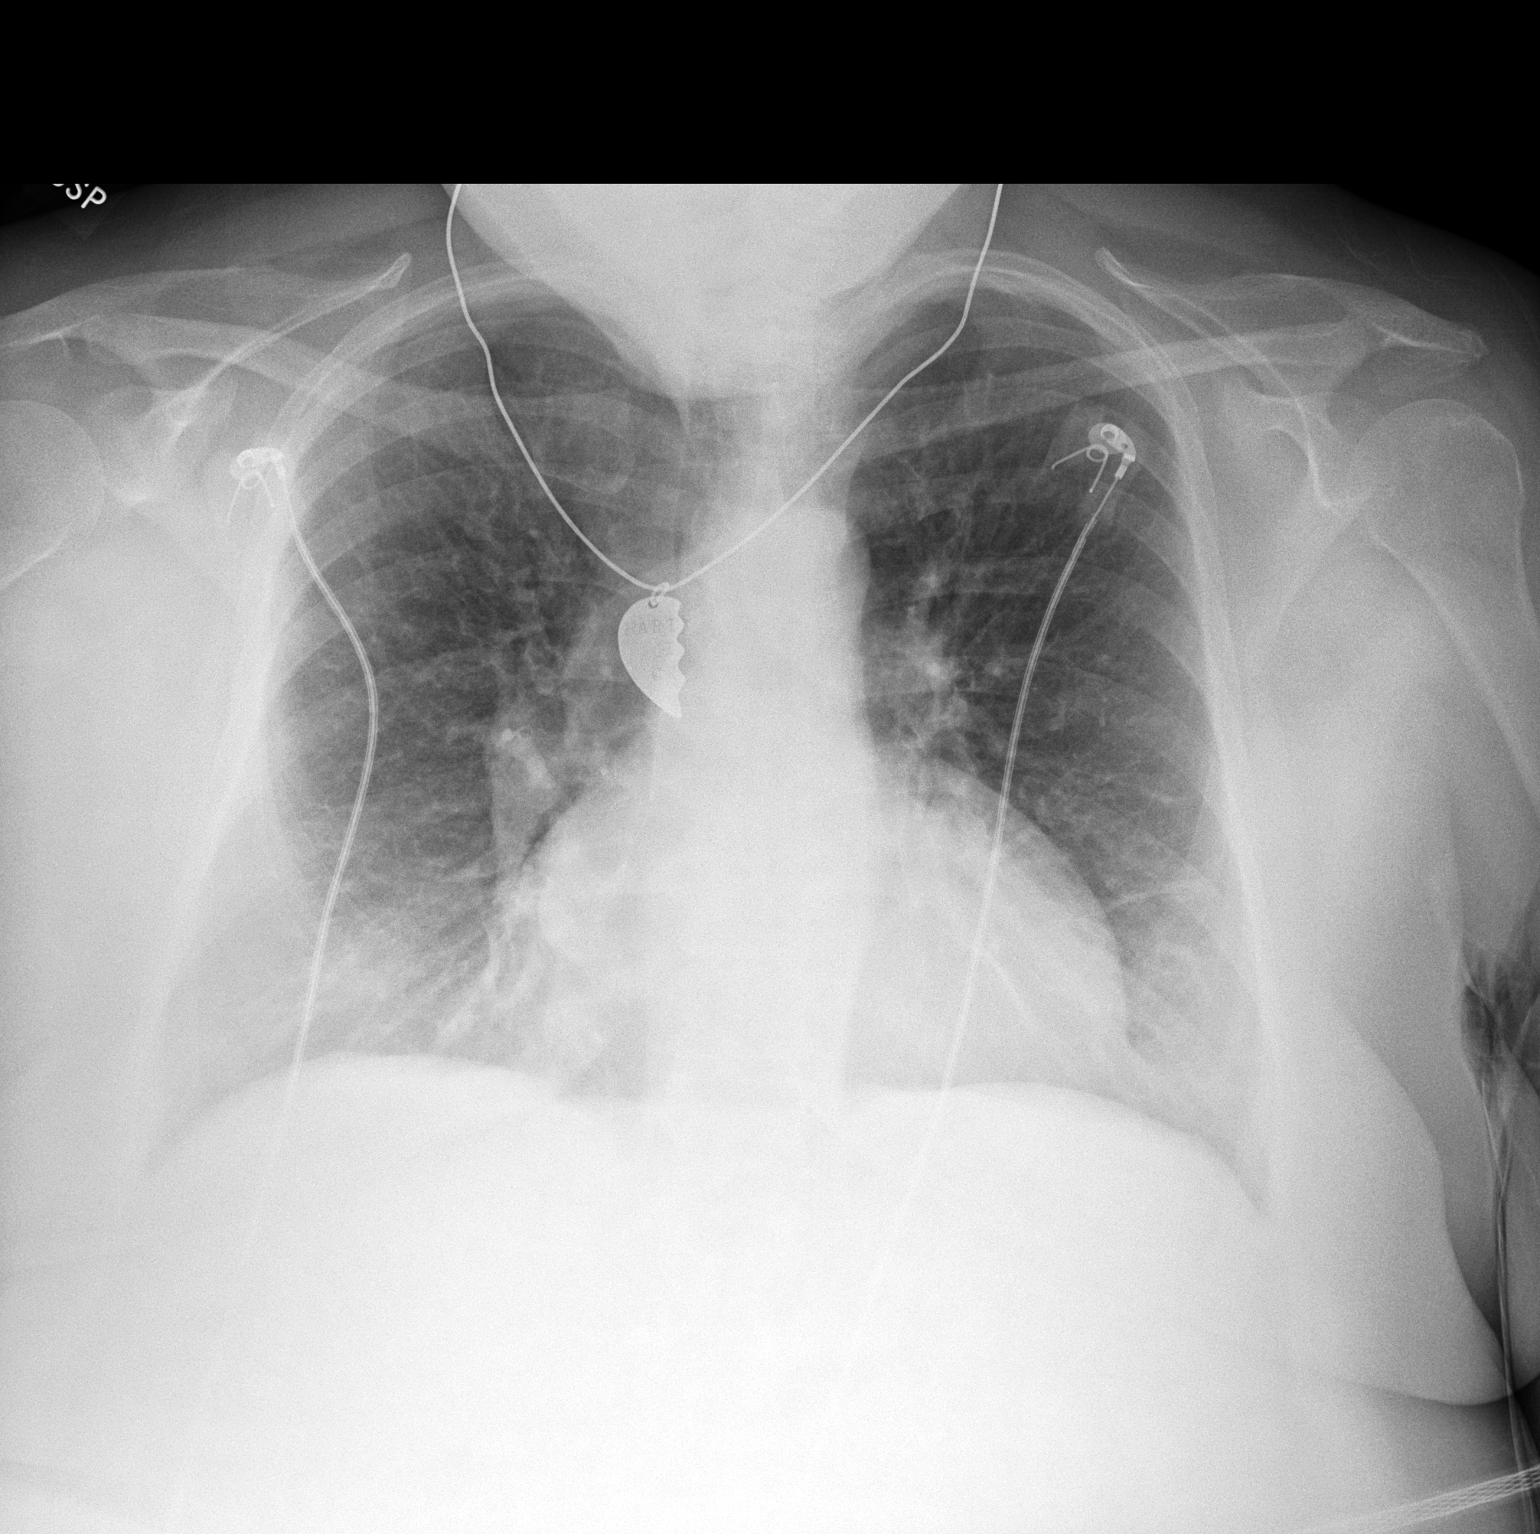

[1 of 1 positions shown; findings below may reference images not displayed]

FINDINGS: 1 view of the chest. The cardiomediastinal contours are within normal limits. No evidence of focal consolidation, pleural effusion, pulmonary edema, or pneumothorax.
IMPRESSION: 
IMPRESSION: No acute cardiopulmonary findings.

## 2022-02-04 IMAGING — CT CT HEART CORONARY ANGIOGRAM
2 of 3 series · 11 of 20 positions shown, 12 images · IV contrast (isovue)
Comparison: none

Procedure: Computed tomography angiography, heart, and coronary arteries, with contrast material, including 3D image postprocessing (including evaluation of cardiac structure and morphology) done with [employer] ISP software.
Acquisition mode: Perspective ECG triggering
Contrast type: Isovue
Complications: None known immediately
Image quality: Good
Scanner:  [employer] Brightview [id number]

[Series 3: coronary cta · axial · 0.49mm/px · z∈[-217,-67]mm · 3 of 241 slices shown, 4 images]
[im 1/241  vessel]
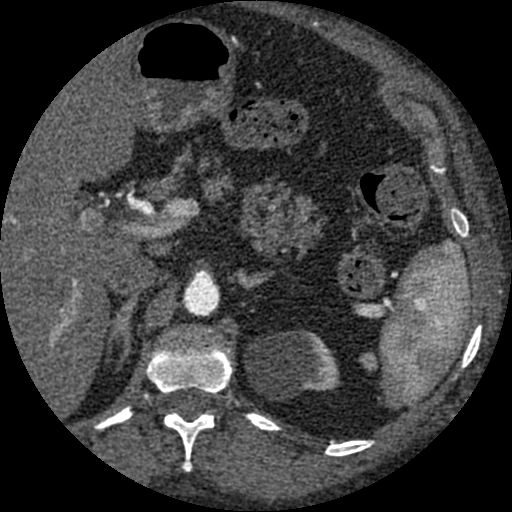
[im 1/241  lung]
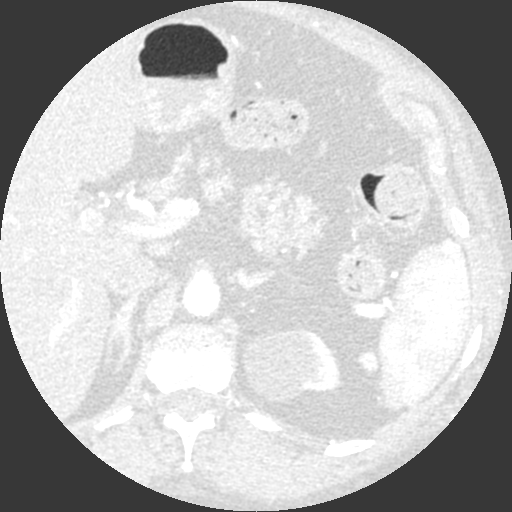
[im 121/241  vessel]
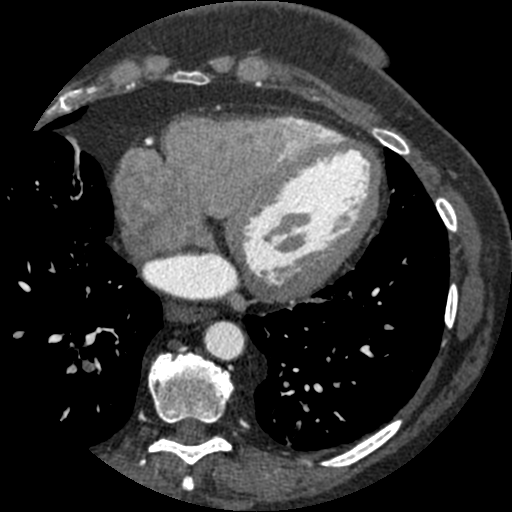
[im 241/241  vessel]
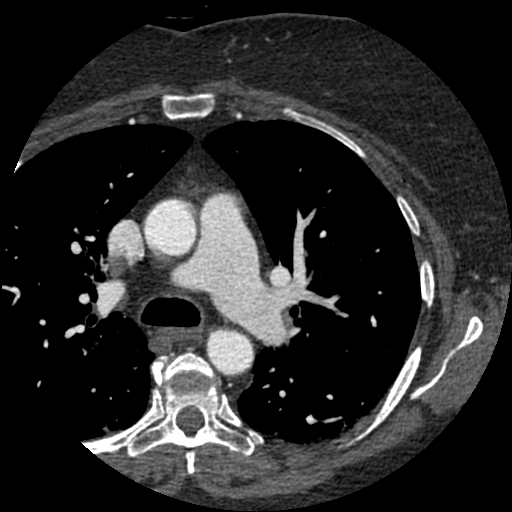

[Series 302: all phases · axial · 0.49mm/px · z∈[-207,-77]mm · 8 of 1210 slices shown]
[im 81/1210  vessel]
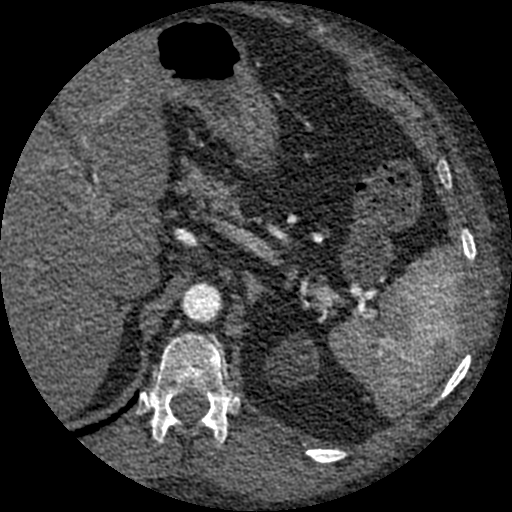
[im 242/1210  vessel]
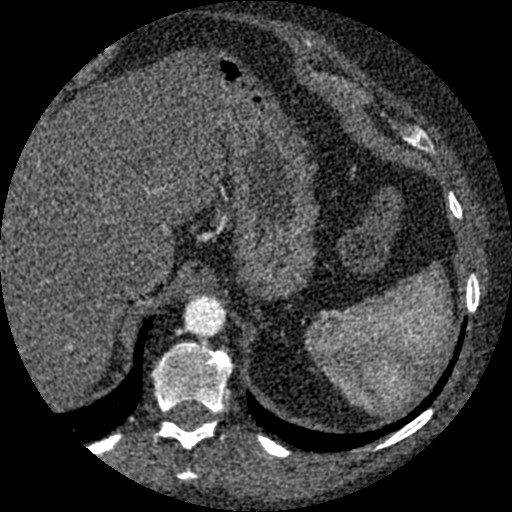
[im 404/1210  vessel]
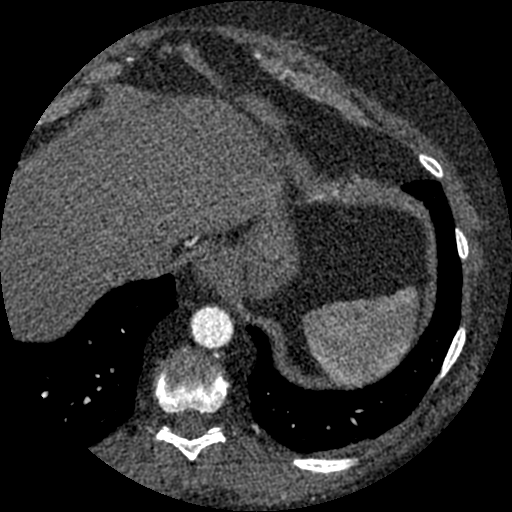
[im 565/1210  vessel]
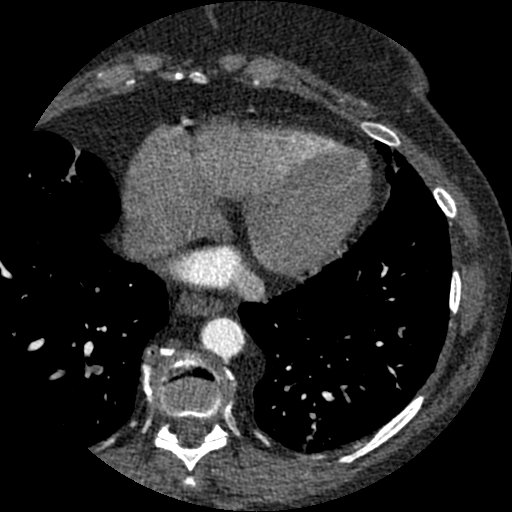
[im 645/1210  vessel]
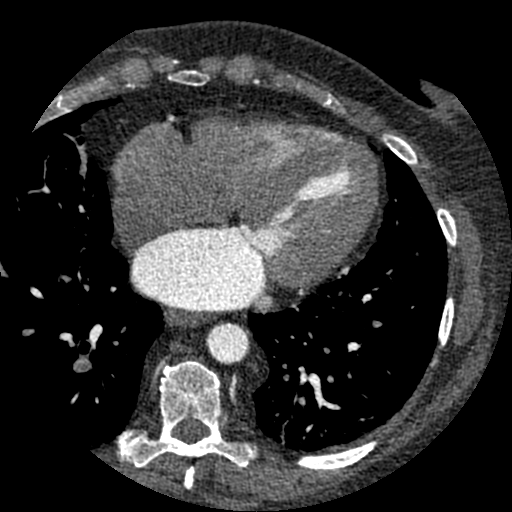
[im 807/1210  vessel]
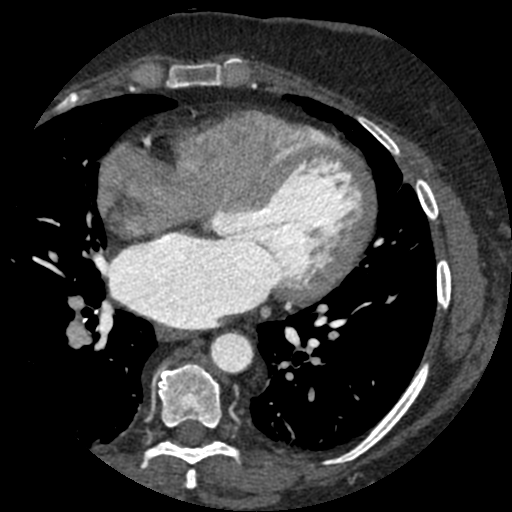
[im 968/1210  vessel]
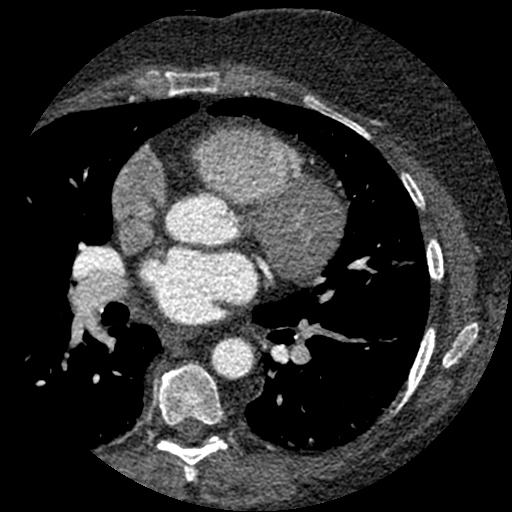
[im 1129/1210  vessel]
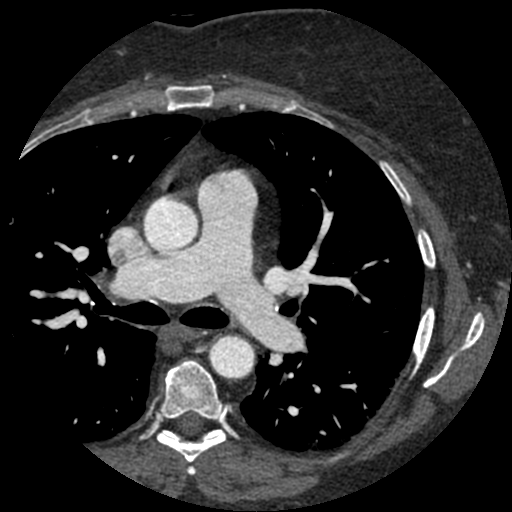

[11 of 20 positions shown; findings below may reference images not displayed]

FINDINGS: Coronary calcium: Coronary calcium score 0.
Coronary angiography:
The left main arises from the left coronary cusp.
The left main bifurcates to form the LAD and left circumflex.
The left main coronary vessel is patent with no evidence of significant plaque or stenosis.
Left anterior descending artery: The proximal LAD is patent with no evidence of significant plaque or stenosis.  The mid and distal LAD has some degradation of images secondary to motion.  The mid to distal vessel does not have any significant plaque that is calcified and no overt stenosis noted.  Small caliber diagonal comes off the LAD that is free of any significant plaque or stenosis.
Left Circumflex: The left circumflex is patent with no evidence of significant plaque or stenosis.  Branch vessel OM1 comes off the circumflex is free of any significant plaque or stenosis.
Right coronary artery: Right dominant and arises from the right coronary cusp.
The right coronary artery vessel is patent with no evidence of significant plaque or stenosis.  Acute marginal branch arises off the right coronary artery is free of any significant stenosis.  Distal RCA bifurcates to PDA and ERICST which are free of any significant plaque or stenosis.
Pulmonary Veins: There are 4 pulmonary veins without any significant abnormality
Pericardium: No significant pericardial effusion
Aorta:
No thoracic aneurysm or dissection noted
Extra-Cardiac Findings: Refer to radiology findings on noncardiac structures reported separately.
IMPRESSION: 1.  No evidence of significant coronary stenosis or significant plaque by coronary CT angiography. Some limitations in visualizing the mid to distal LAD (Motion degradation).
2.  Coronary calcium score 0
Okafor, Soraida

## 2022-04-20 IMAGING — MG MAMMO BREAST SCREENING BILATERAL
8 series · 8 of 8 positions shown · non-contrast
Comparison: 4744, 7977, 2323

This is a summary report. The complete report is available in the patient's medical record. If you cannot access the medical record, please contact the sending organization for a detailed fax or copy.
FINAL REPORT:
EXAM: MAMMO BREAST SCREENING BILATERAL
CLINICAL HISTORY: Screening mammogram. History of breast reduction surgery
TECHNIQUE: Digital mammography was performed with CAD. Routine views were obtained.

[R MLO (1 of 2)]
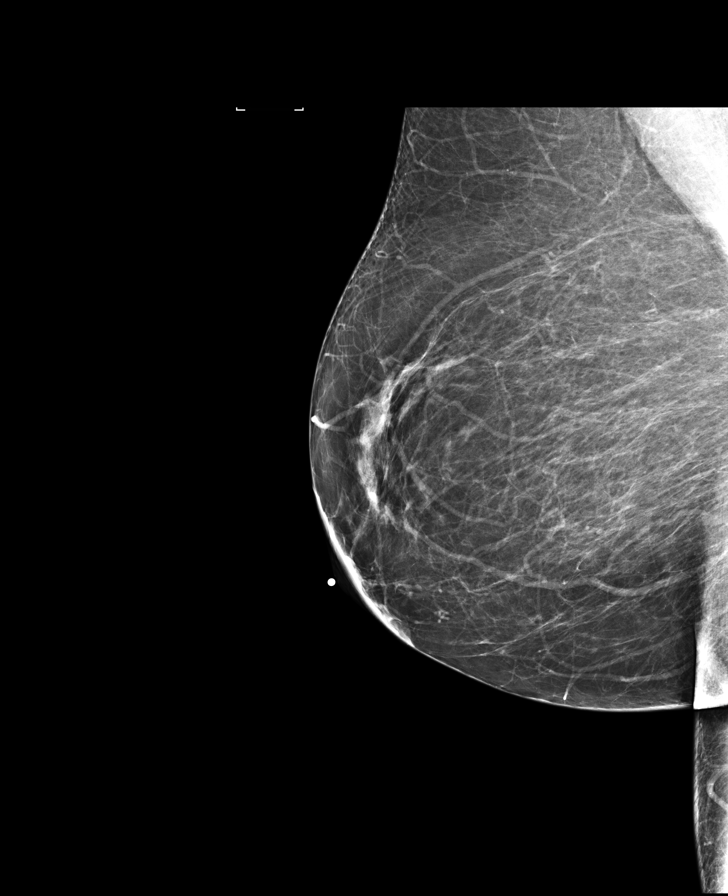

[R CC]
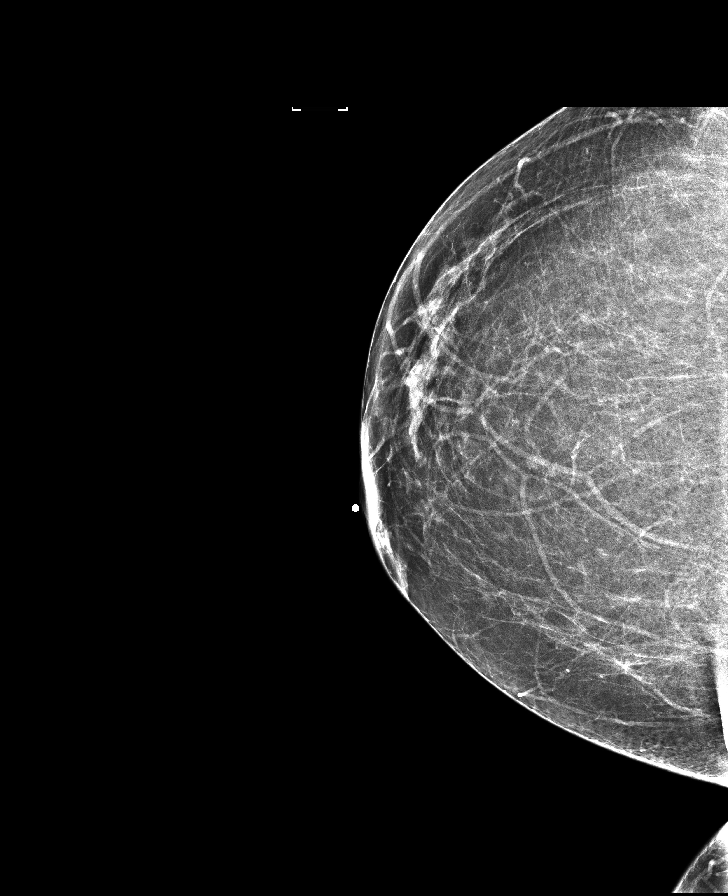

[L XCCL]
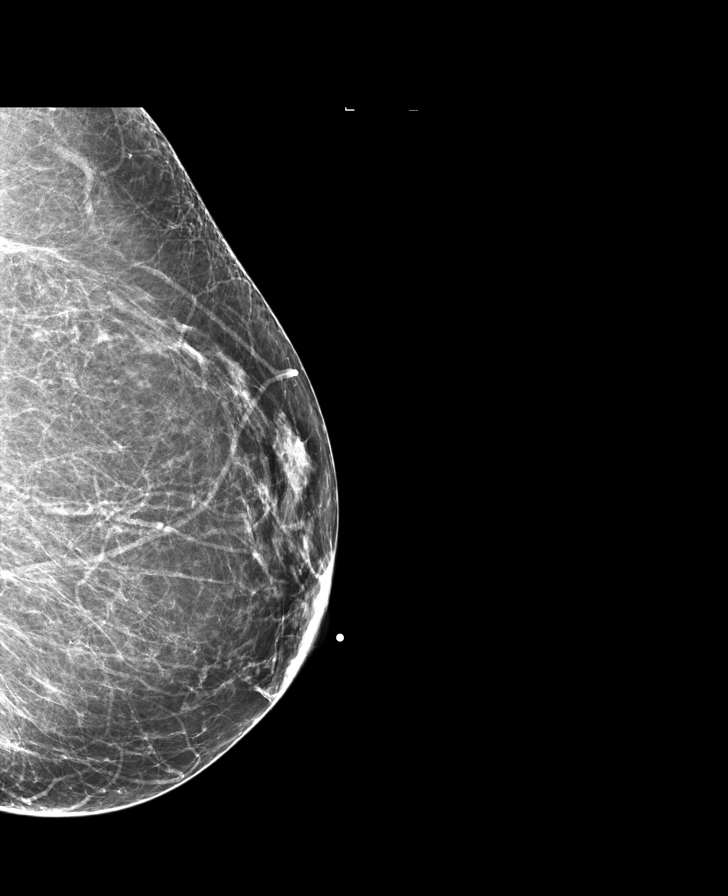

[L CC]
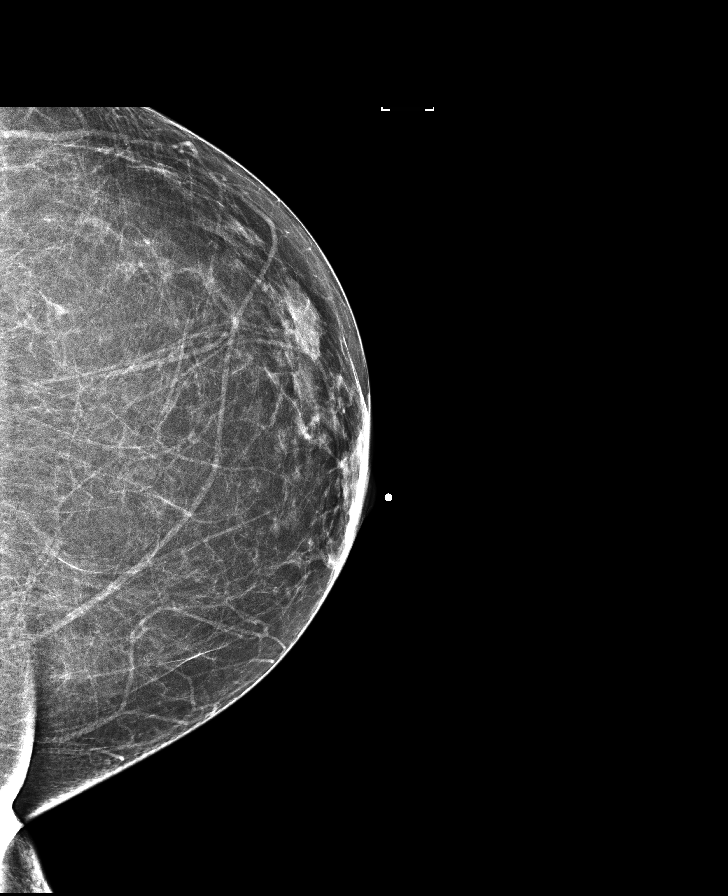

[L MLO (1 of 2)]
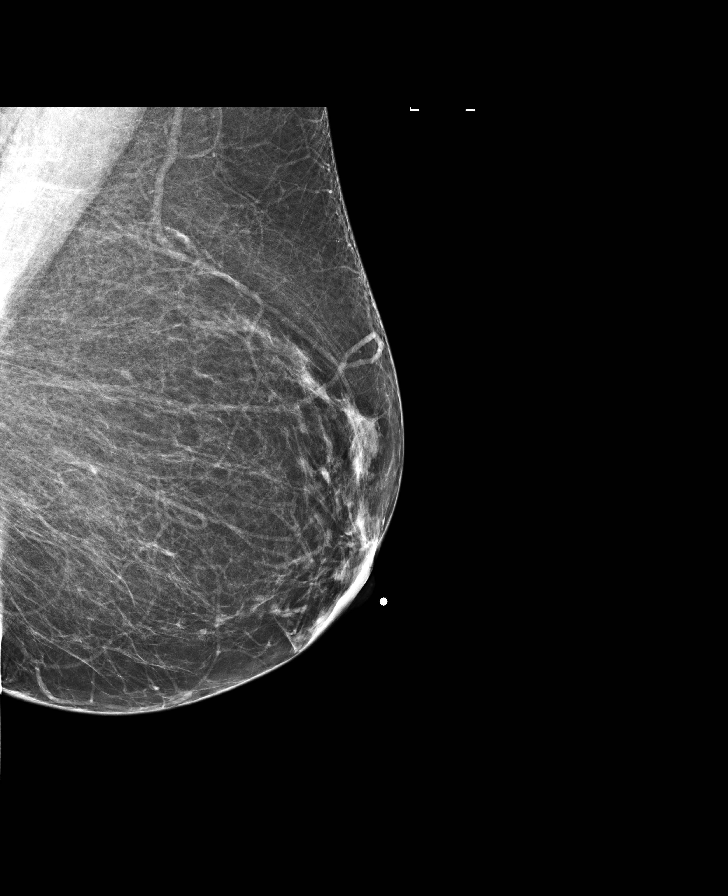

[R XCCL]
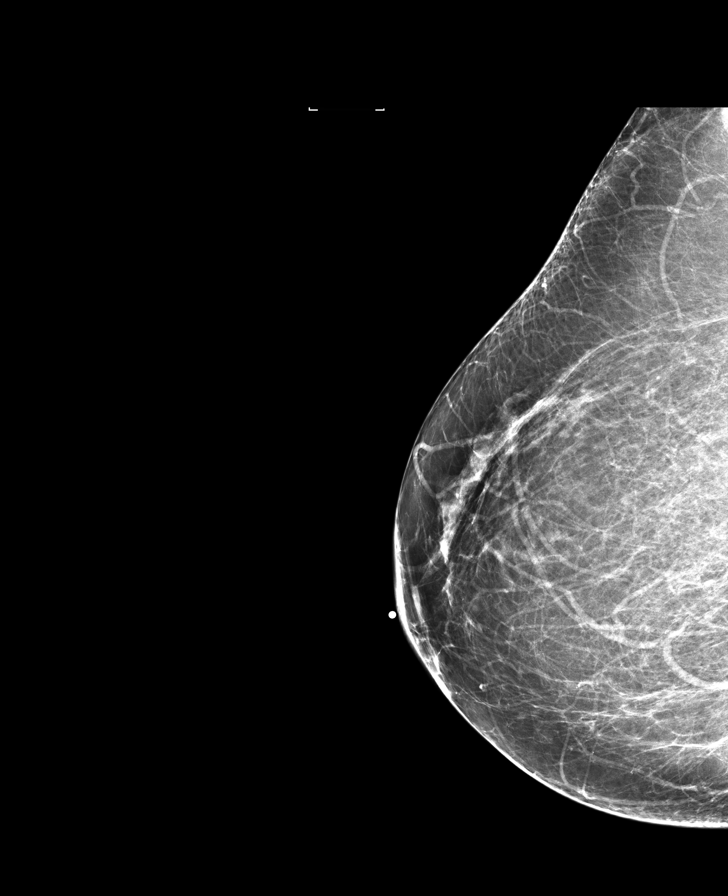

[R MLO (2 of 2)]
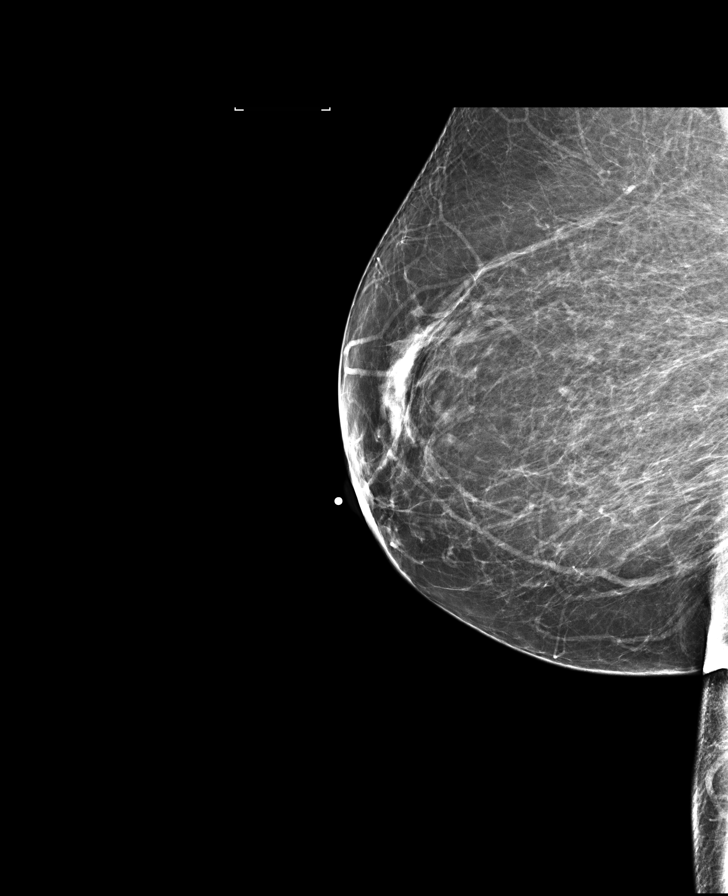

[L MLO (2 of 2)]
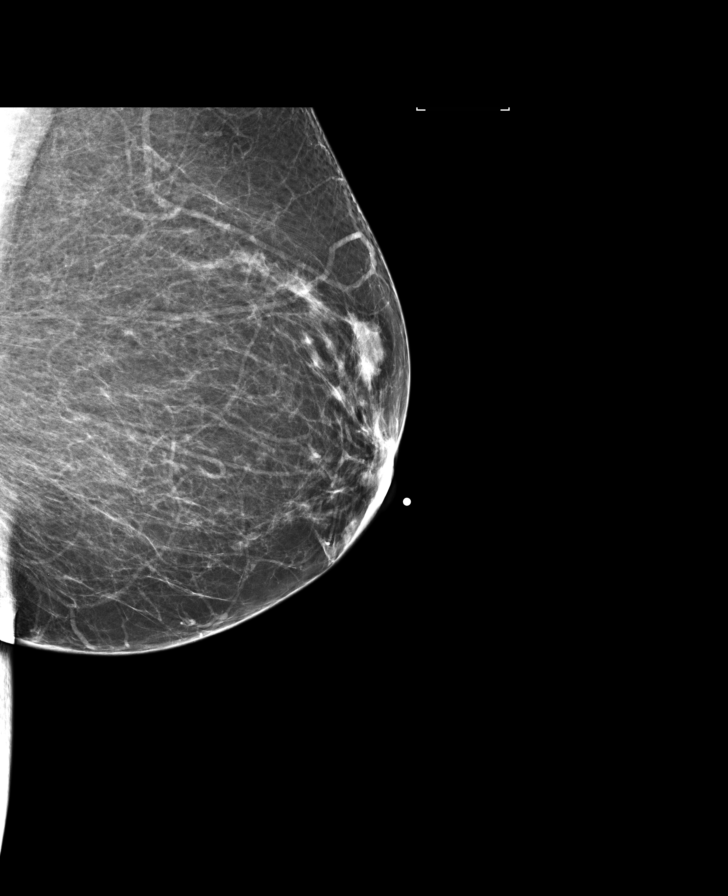

[8 of 8 positions shown; findings below may reference images not displayed]

FINDINGS: No suspicious masses, suspicious microcalcifications, or unexplained distortion.
IMPRESSION: No evidence of malignancy.
BI-RADS Category 1: Negative
Breast Density B: There are scattered areas of fibroglandular density
Recommendation: Annual screening

## 2022-12-23 IMAGING — DX XR KNEE 2 VIEWS LEFT
1 series · 2 of 2 positions shown · non-contrast
Comparison: None available.

FINAL REPORT:
XR KNEE 2 VIEWS LEFT
INDICATION: Pain in left knee

[Series 5242: AP · left · 0.18mm/px · 2 of 2 slices shown]
[im 1/2]
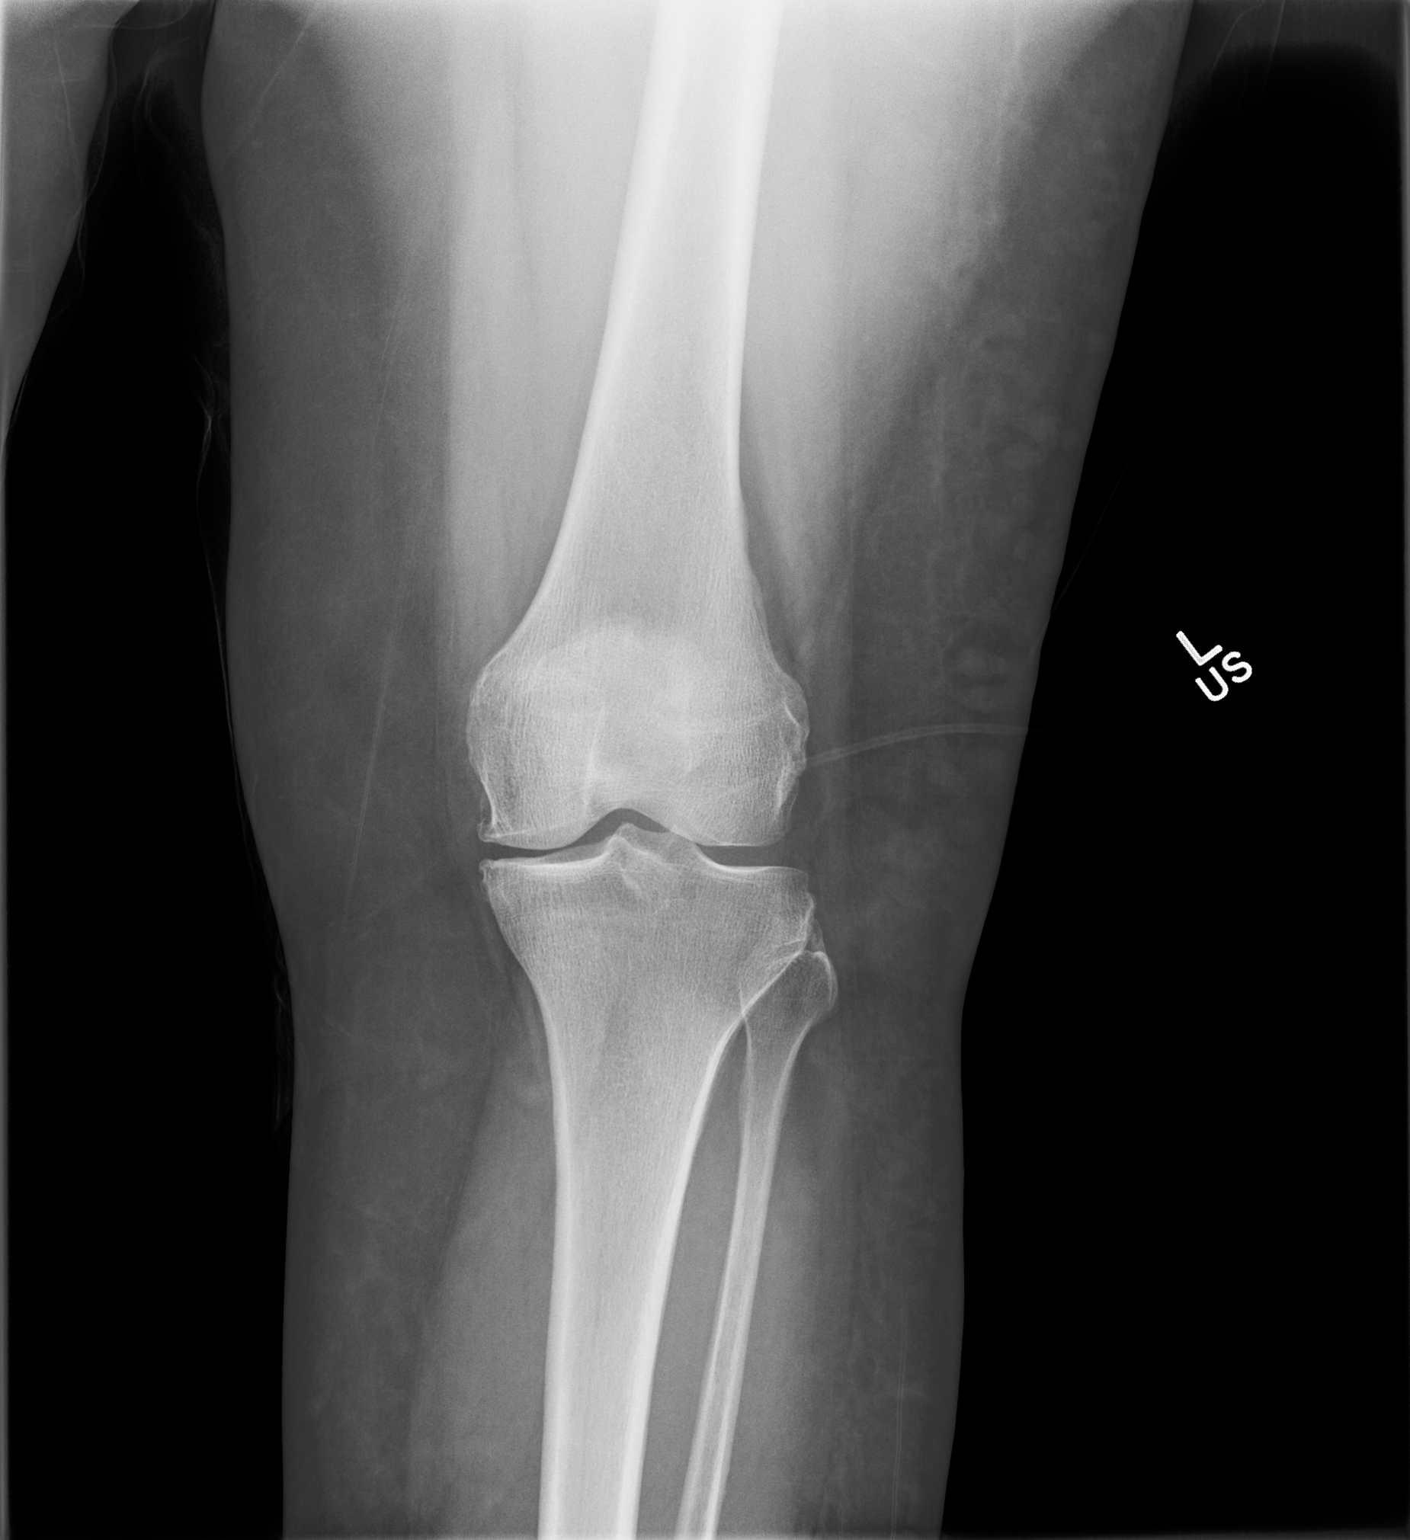
[im 2/2]
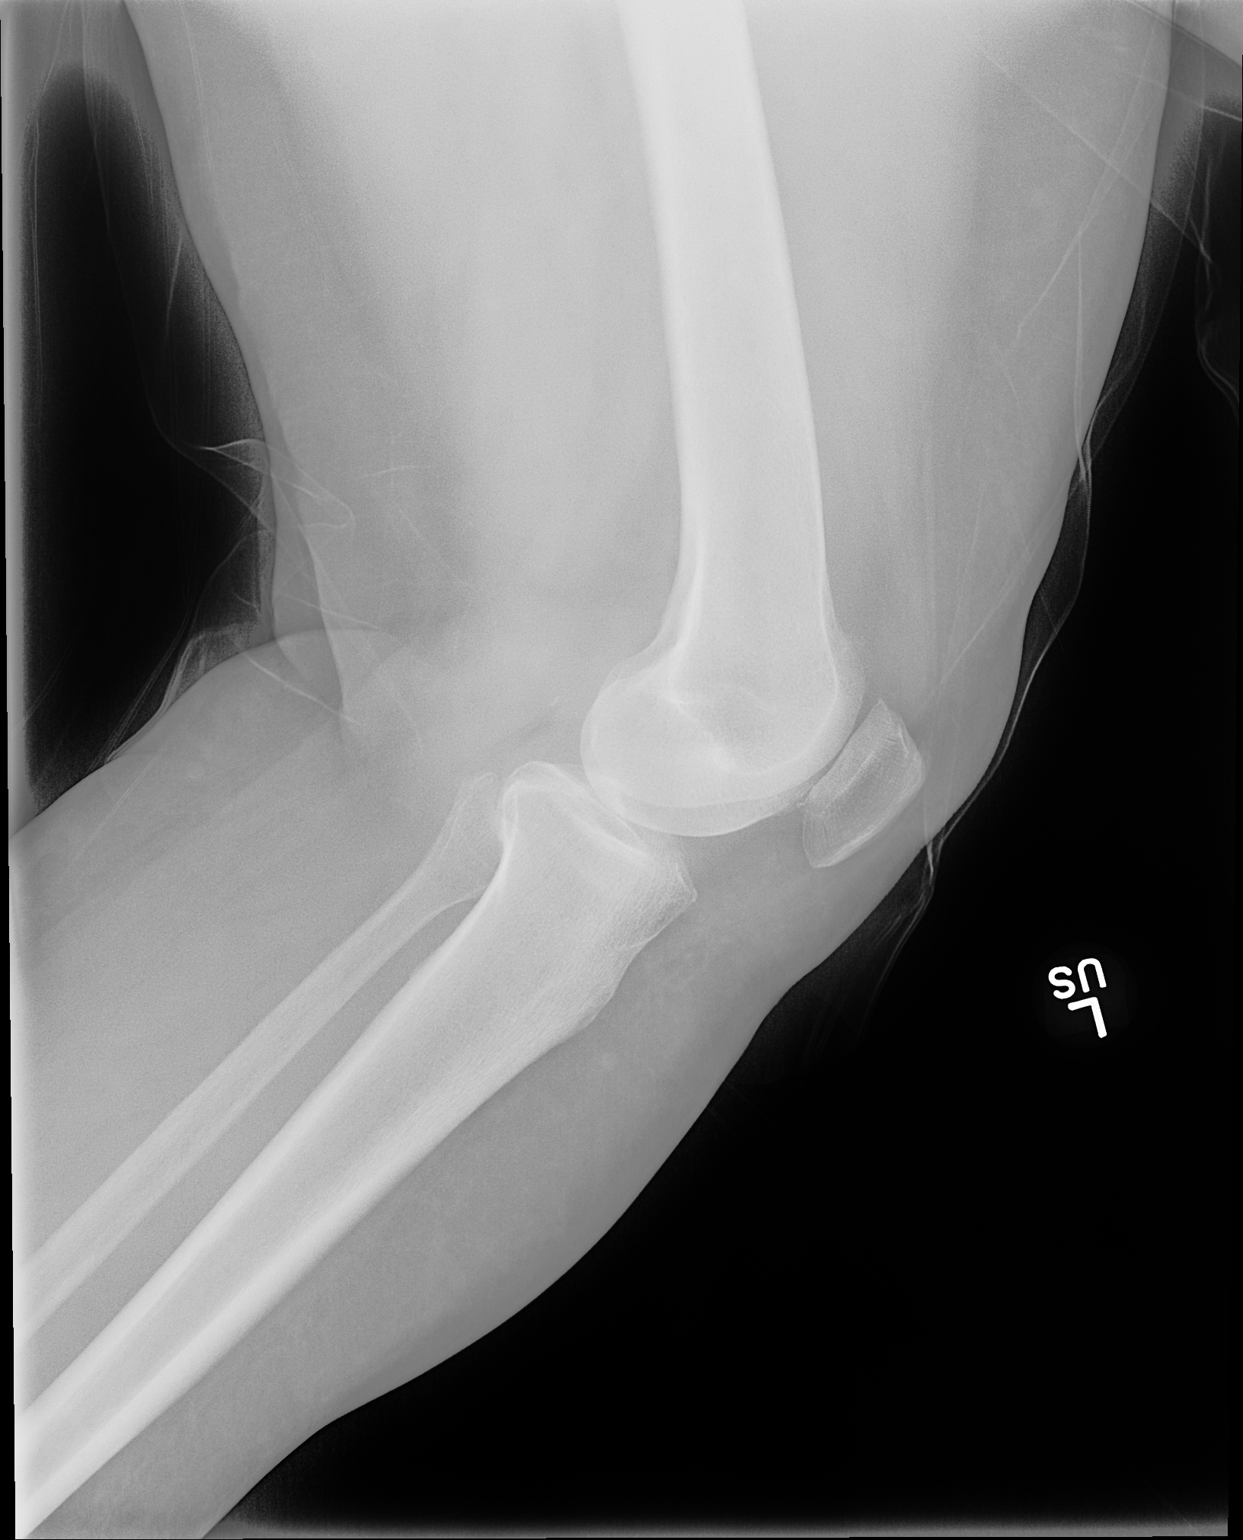

[2 of 2 positions shown; findings below may reference images not displayed]

FINDINGS: 2 views were obtained.
No acute fracture or malalignment. There is tricompartmental joint space loss with osteophytosis and subchondral sclerosis. No soft tissue swelling, radiopaque foreign body or gas.
IMPRESSION: Mild osteoarthritic change of the left knee.

## 2023-01-09 IMAGING — MR MRI LUMBAR SPINE WITHOUT CONTRAST
5 of 7 series · 41 of 48 positions shown · non-contrast
Comparison: None available

FINAL REPORT:
MRI LUMBAR SPINE WITHOUT CONTRAST
TECHNIQUE: lumbar spine was obtained.
INDICATION: Radiculopathy, lumbar region

[Series 5: T2 · sagittal · 4.0mm · 0.62mm/px · 5 of 17 slices shown (1 of 2)]
[im 1/17]
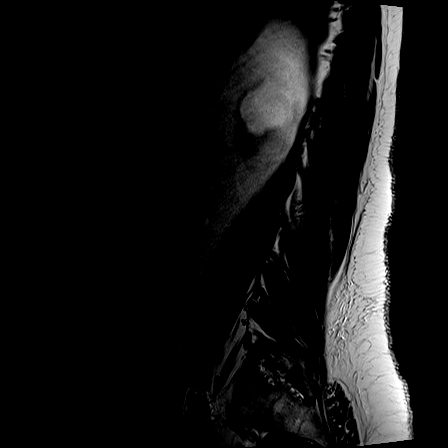
[im 5/17]
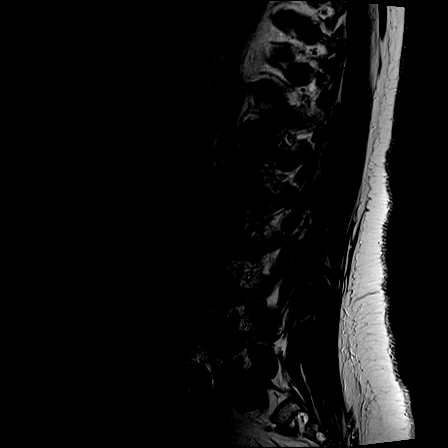
[im 9/17]
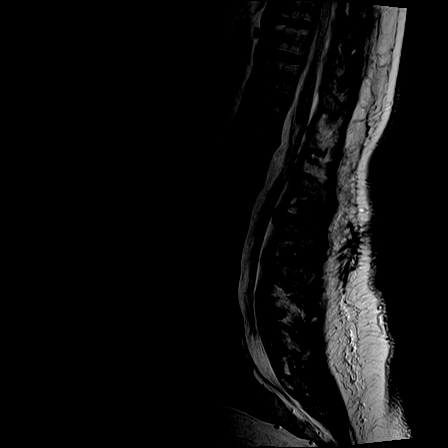
[im 13/17]
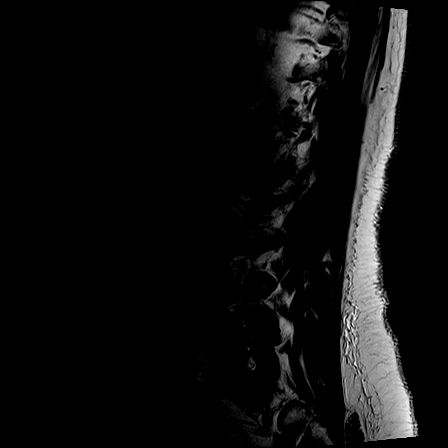
[im 17/17]
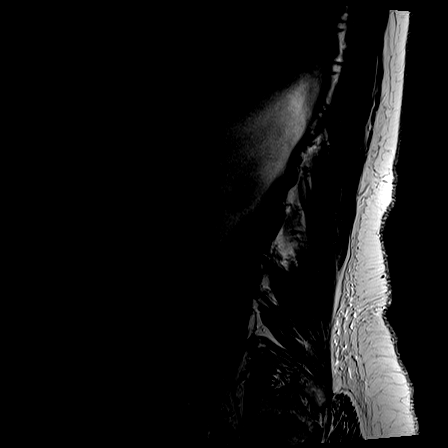

[Series 6: STIR · sagittal · 4.0mm · 0.88mm/px · 5 of 17 slices shown]
[im 1/17]
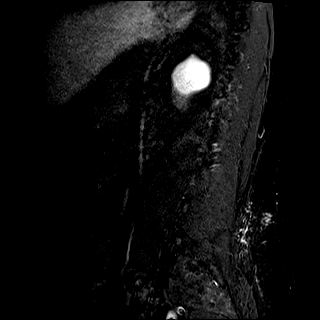
[im 5/17]
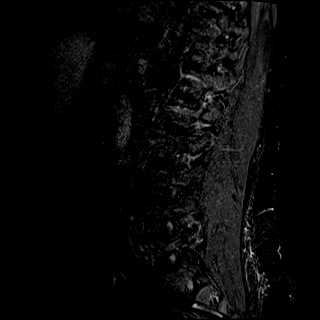
[im 9/17]
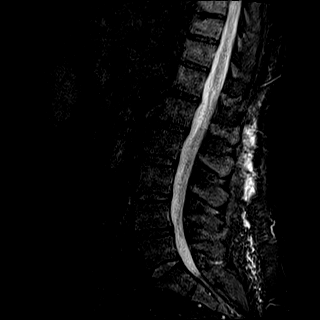
[im 13/17]
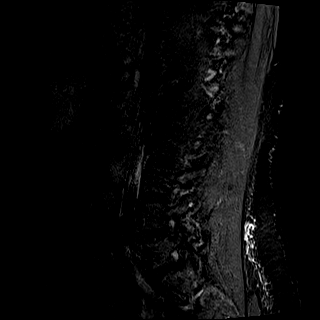
[im 17/17]
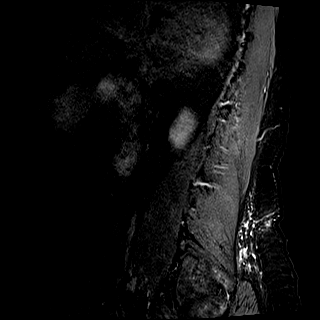

[Series 7: T1 · sagittal · 4.0mm · 0.73mm/px · 5 of 17 slices shown (1 of 2)]
[im 1/17]
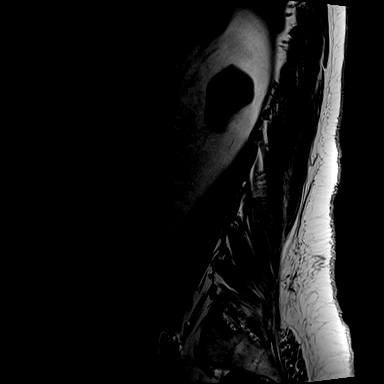
[im 5/17]
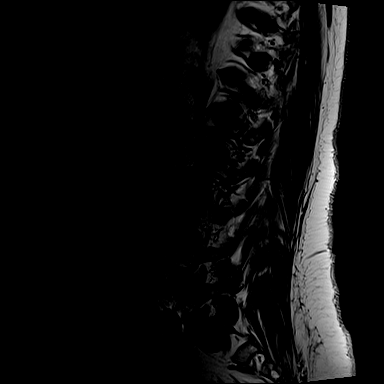
[im 9/17]
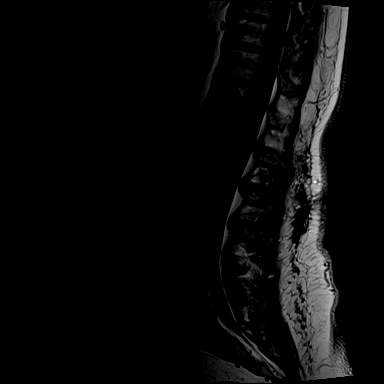
[im 13/17]
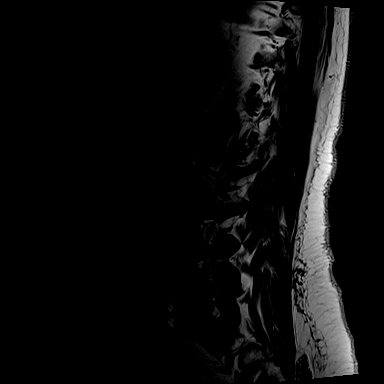
[im 17/17]
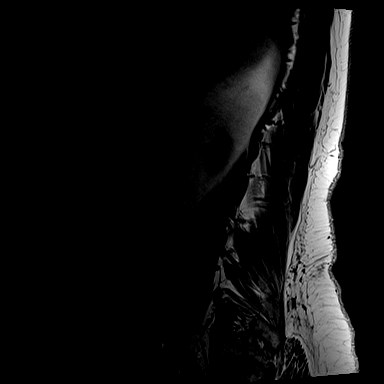

[Series 10: T2 · axial · 4.0mm · 0.69mm/px · z∈[-114,+116]mm · 13 of 43 slices shown (2 of 2)]
[im 1/43]
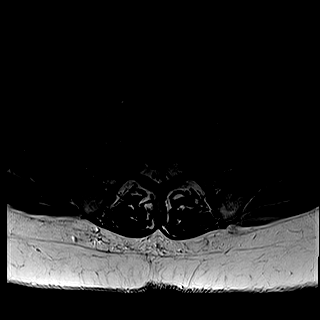
[im 4/43]
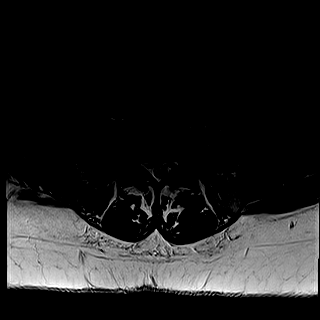
[im 8/43]
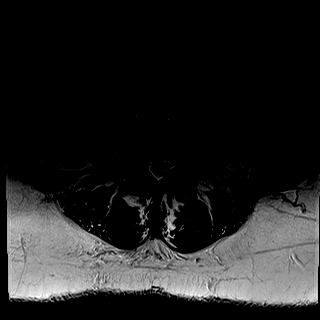
[im 11/43]
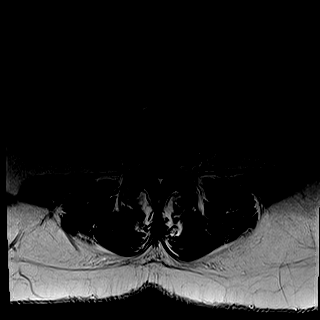
[im 15/43]
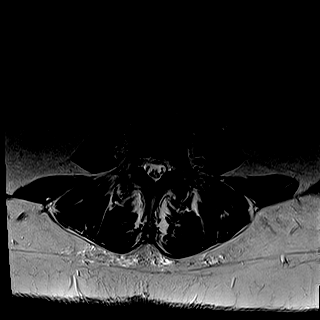
[im 18/43]
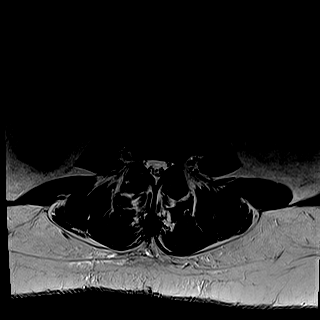
[im 22/43]
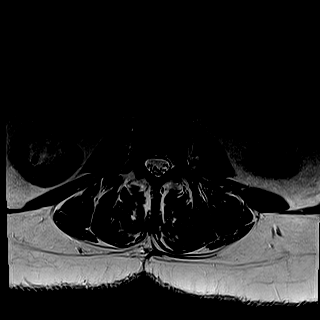
[im 25/43]
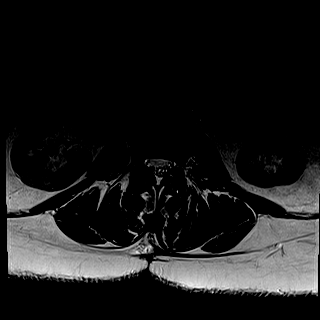
[im 29/43]
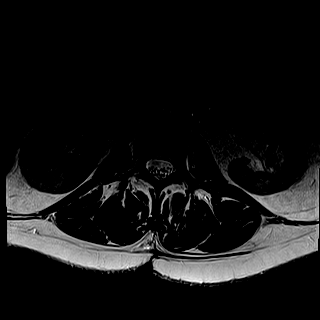
[im 32/43]
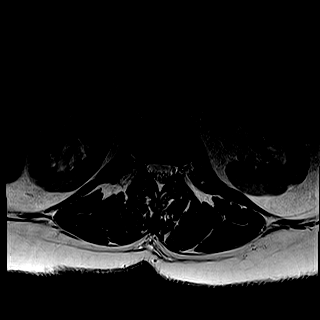
[im 36/43]
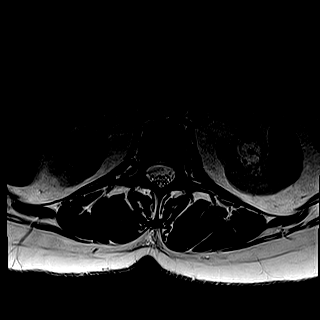
[im 39/43]
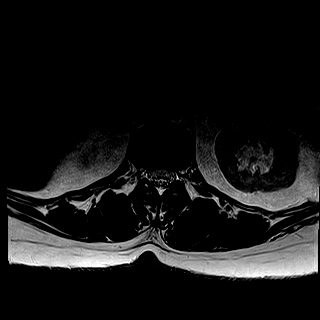
[im 43/43]
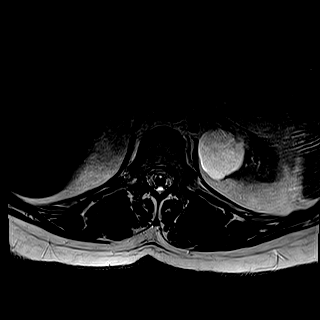

[Series 13: T1 · axial · 4.0mm · 0.69mm/px · z∈[-114,+116]mm · 13 of 43 slices shown (2 of 2)]
[im 1/43]
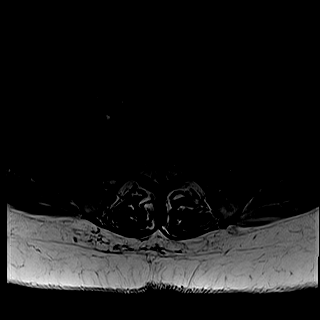
[im 4/43]
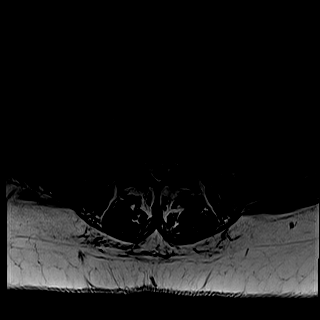
[im 8/43]
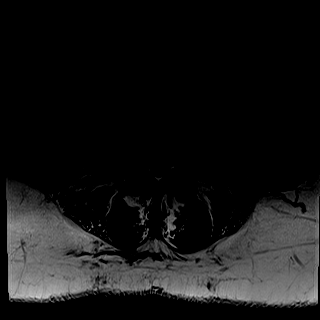
[im 11/43]
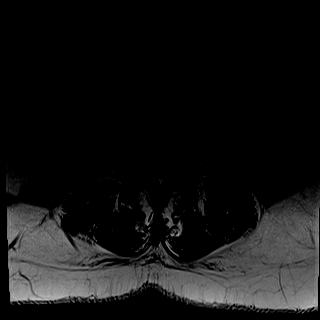
[im 15/43]
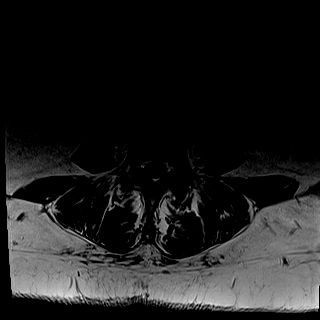
[im 18/43]
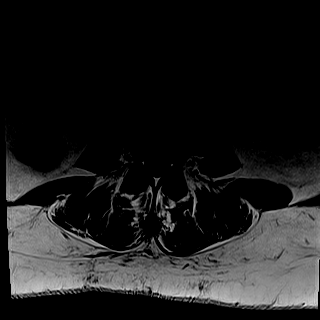
[im 22/43]
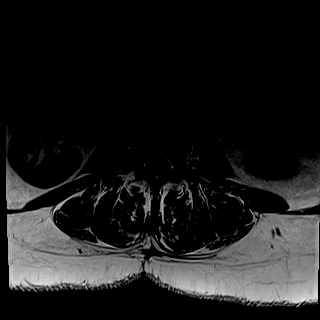
[im 25/43]
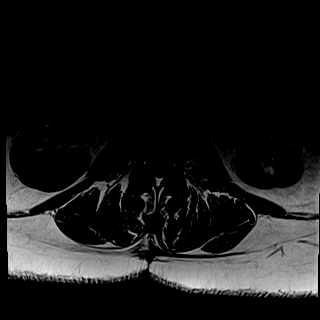
[im 29/43]
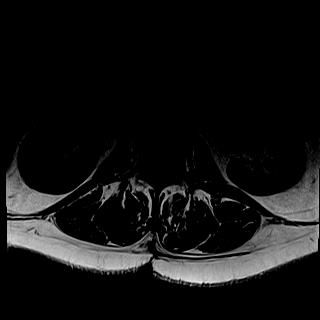
[im 32/43]
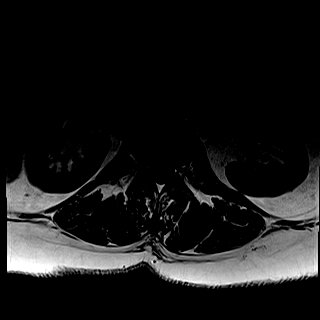
[im 36/43]
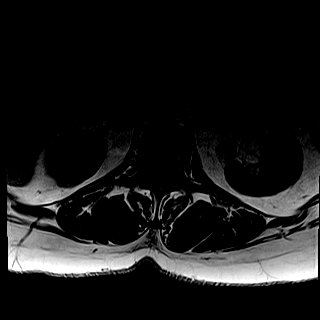
[im 39/43]
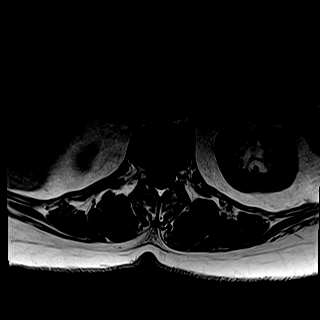
[im 43/43]
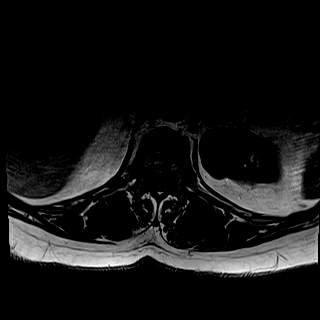

[41 of 48 positions shown; findings below may reference images not displayed]

FINDINGS: Vertebral body: without fracture.
Disk: There is normal disk height.
Cord Signal: Normal
Marrow Signal: Normal
Axial images demonstrate mild facet hypertrophy throughout the lumbar spine without significant central spinal canal or neural foraminal stenosis from T12-L4
At L4-5 there is a broad-based disc bulge narrowing the AP dimension of the thecal sac to 7.1 mm with out significant neural foraminal stenosis.
Prevertebral soft tissues: There is a 3.6 x 3.1 cm thinly septated left renal cyst.
Additional findings: None.
IMPRESSION: 
IMPRESSION: 1.  Mild degenerative change most prominent at L4-5 where there is a broad-based disc bulge narrowing the AP dimension of the thecal sac to 7.1 mm.
2.  3.6 x 3.1 cm thinly septated left renal cyst. This may be further evaluated by ultrasound.

## 2023-03-22 IMAGING — US US LOW EXT VARICOSE VEINS BILAT
1 series · 12 of 16 positions shown · non-contrast
Comparison: none

FINAL REPORT:
REASON FOR EXAM: Evaluation of venous insufficiency
STUDY PERFORMED:  Bilateral lower extremity venous duplex with reflux evaluation.

[Series 1: us low ext varicose veins bilat · 12 of 89 slices shown]
[im 1/89]
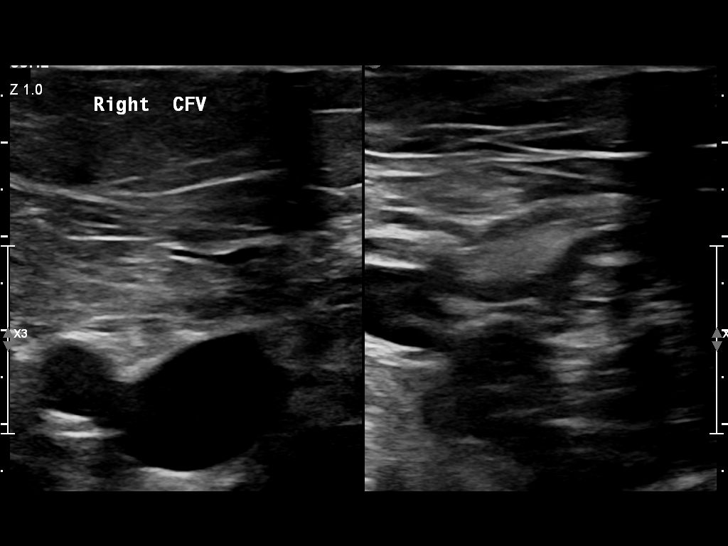
[im 12/89]
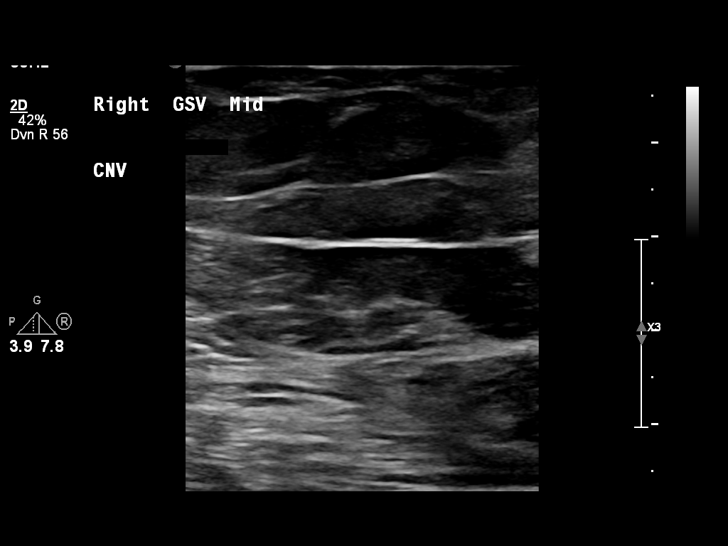
[im 18/89]
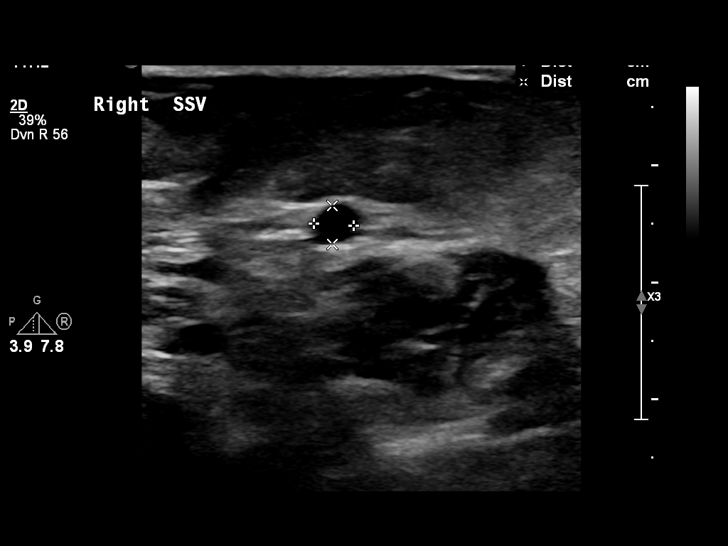
[im 24/89]
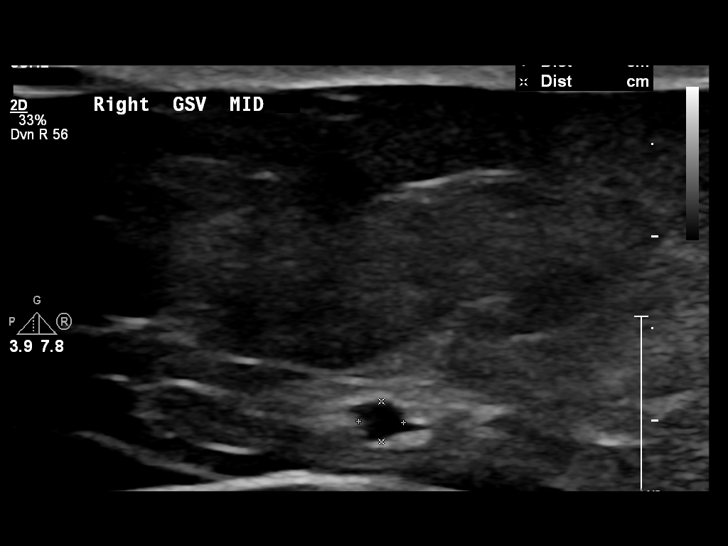
[im 36/89]
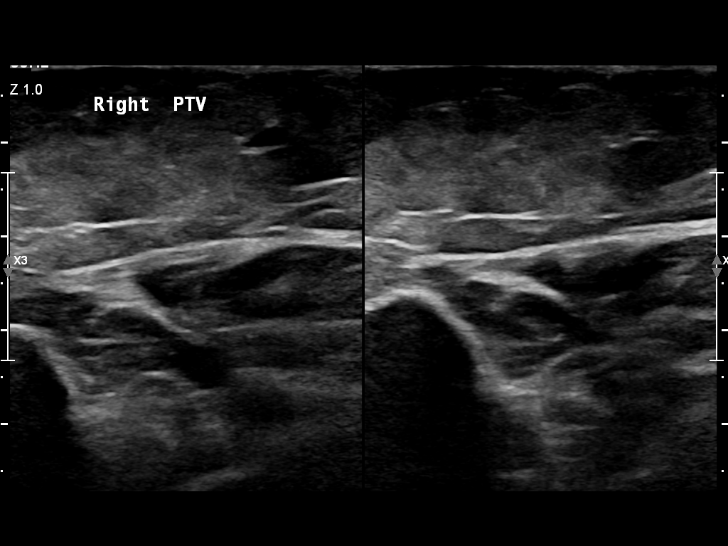
[im 42/89]
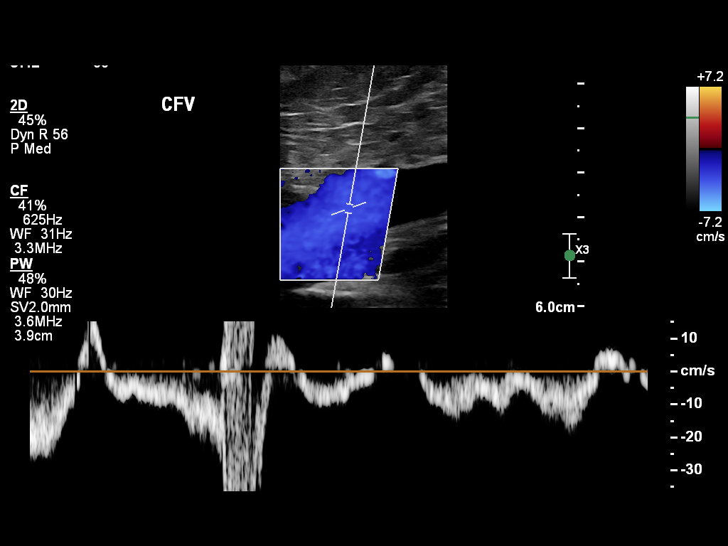
[im 47/89]
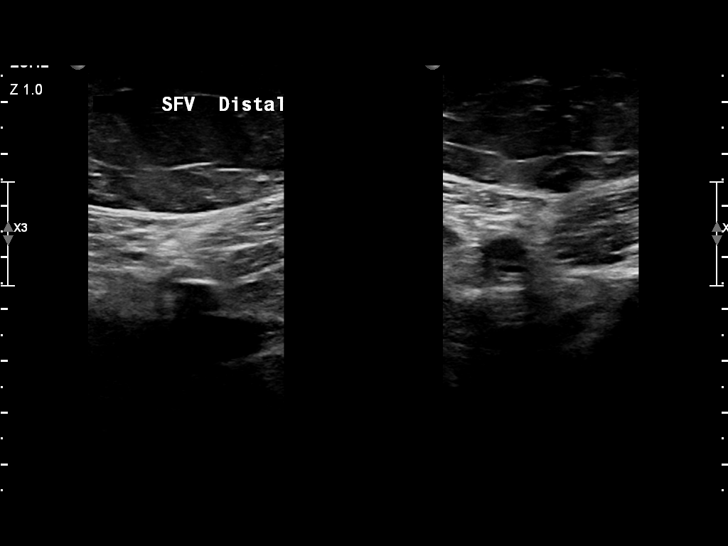
[im 59/89]
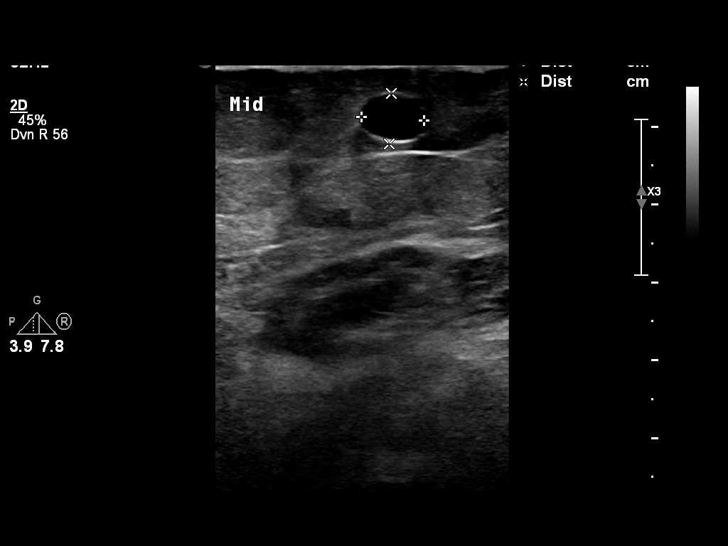
[im 65/89]
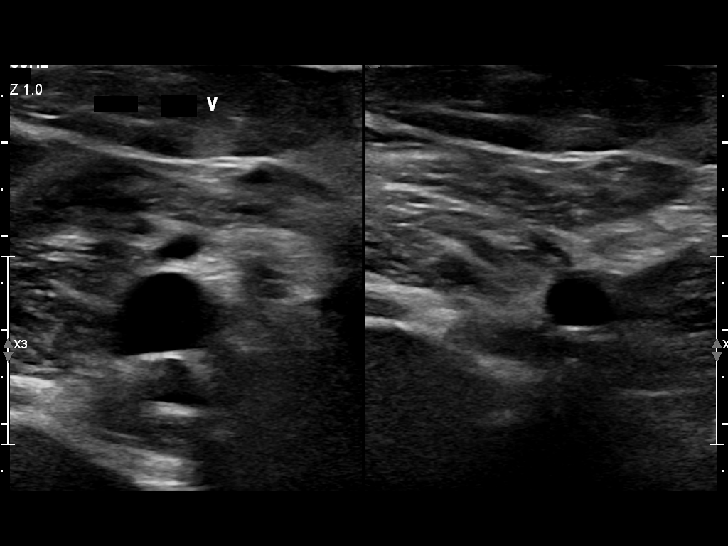
[im 71/89]
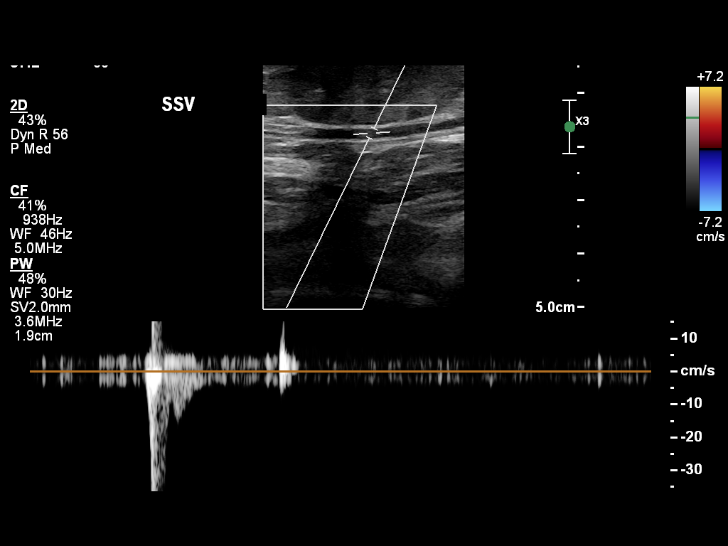
[im 83/89]
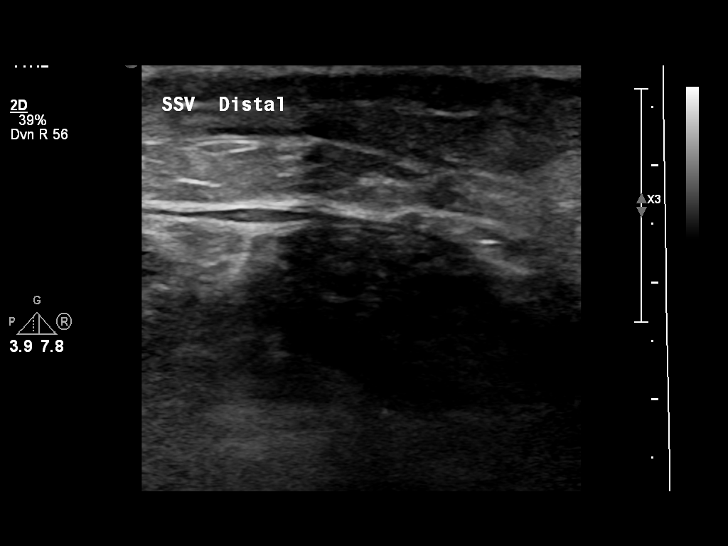
[im 89/89]
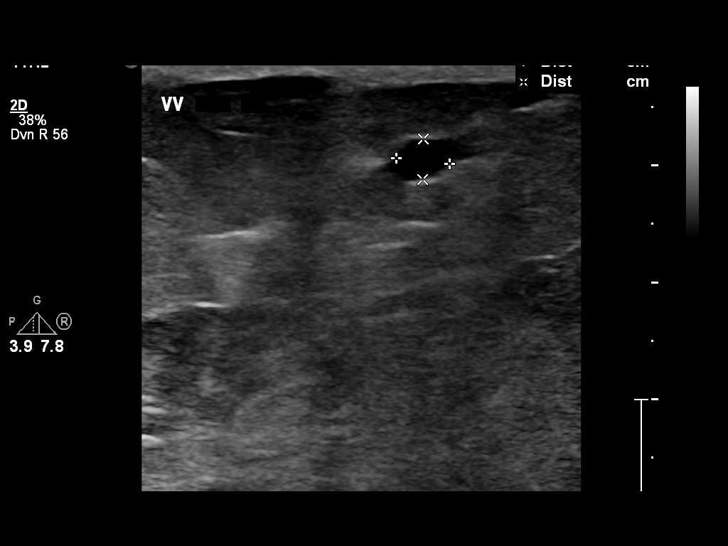

[12 of 16 positions shown; findings below may reference images not displayed]

FINDINGS: Right lower extremity deep veins are all examined including the common femoral vein, femoral vein in the thigh, popliteal vein, posterior tibial vein, and peroneal veins.  All are
found to be free of DVT.
The deep veins demonstrated no deep reflux.
The right great saphenous and small saphenous veins are examined.
The saphenofemoral junction measures 3.6 mm and demonstrates no reflux.
Great saphenous vein in the proximal thigh not visualized.
Great saphenous vein in the mid thigh not visualized.
Great saphenous vein in the distal thigh not visualized.
The great saphenous vein in the proximal calf measures 3.1 mm and demonstrates reflux with a duration of 6.6 seconds.
The great saphenous vein in the mid calf measures 2.4 mm and tortuous and demonstrates reflux with a duration of 2 seconds.
Great saphenous vein in the distal calf measures 3.8 mm and tortuous and demonstrates reflux with a duration of 6.5 seconds.
The saphenopopliteal junction measures 2.6 mm and demonstrates no reflux.
The small saphenous vein measures 3.4 mm proximally, mid segment 2.9 mm, and 2.7 mm distally and demonstrates no reflux.
Multiple right calf varicosities.
Left lower extremity deep veins are all examined including the common femoral vein, femoral vein in the thigh, popliteal vein, posterior tibial vein, and peroneal veins.  All are
found to be free of DVT.
The deep veins demonstrated no deep reflux.
The left great saphenous and small saphenous veins are examined.
The saphenofemoral junction measures 6.5 mm and demonstrates reflux with a duration of 6.6 seconds.
Great saphenous vein in the proximal thigh measures 4.9 mm and demonstrates no reflux.
Great saphenous vein in the mid thigh not visualized.
Great saphenous vein in the distal thigh not visualized.
The great saphenous vein in the proximal calf not visualized.
The great saphenous vein in the mid calf measures 2.9 mm and demonstrates no reflux.
Great saphenous vein in the distal calf measures 1.7 mm and demonstrates reflux with a duration of 5 seconds.
The saphenopopliteal junction measures 1.8 mm and demonstrates no reflux.
The small saphenous vein measures 2.1 mm proximally, mid segment 2.6 mm, and 2.9 mm distally and demonstrates no reflux.
There is an anterior accessory saphenous vein that measures 8.9 mm proximally and 8.1 mm in the mid segment where it becomes tortuous. In the distal segment it remains tortuous and measures 4.6 mm. The anterior accessory saphenous vein demonstrates reflux throughout with a duration of 3.8 seconds or greater.
Proximal calf varicosity measuring 4.6 mm.
IMPRESSION: 1.  There is no evidence of deep vein thrombosis in bilateral lower extremities.
2.   The right lower extremity demonstrates superficial venous reflux isolated to the right calf great saphenous vein. It is noted to be quite tortuous and would recommend correlation with patient's prior ablative procedure. The right thigh great saphenous vein was not visualized consistent with patient's prior history of ablation..
3.   The left lower extremity demonstrates superficial venous reflux isolated throughout the entire anterior accessory saphenous vein as well as mid calf great saphenous vein and saphenofemoral junction. The left mid thigh to proximal calf great saphenous vein was not visualized consistent with likely prior ablative procedure.
